# Patient Record
Sex: Male | Born: 1950
Health system: Southern US, Community
[De-identification: ages and names within clinical notes are randomized; demographics above are authoritative.]

## PROBLEM LIST (undated history)

## (undated) DIAGNOSIS — I10 Essential (primary) hypertension: Secondary | ICD-10-CM

## (undated) DIAGNOSIS — C61 Malignant neoplasm of prostate: Secondary | ICD-10-CM

## (undated) DIAGNOSIS — E7800 Pure hypercholesterolemia, unspecified: Secondary | ICD-10-CM

## (undated) DIAGNOSIS — Z973 Presence of spectacles and contact lenses: Secondary | ICD-10-CM

## (undated) DIAGNOSIS — E785 Hyperlipidemia, unspecified: Secondary | ICD-10-CM

## (undated) HISTORY — PX: INGUINAL HERNIA REPAIR: SUR1180

## (undated) HISTORY — PX: PROSTATE BIOPSY: SHX241

## (undated) HISTORY — PX: HERNIA REPAIR: SHX51

---
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
## 2002-10-21 ENCOUNTER — Observation Stay (HOSPITAL_COMMUNITY): Admission: RE | Admit: 2002-10-21 | Discharge: 2002-10-22 | Payer: Self-pay | Admitting: General Surgery

## 2007-01-17 ENCOUNTER — Emergency Department (HOSPITAL_COMMUNITY): Admission: EM | Admit: 2007-01-17 | Discharge: 2007-01-17 | Payer: Self-pay | Admitting: Emergency Medicine

## 2007-11-25 ENCOUNTER — Emergency Department (HOSPITAL_COMMUNITY): Admission: EM | Admit: 2007-11-25 | Discharge: 2007-11-25 | Payer: Self-pay | Admitting: Emergency Medicine

## 2010-10-04 NOTE — Op Note (Signed)
NAME:  Kent Smith, Kent Smith                        ACCOUNT NO.:  [id number]   MEDICAL RECORD NO.:  [id number]                   PATIENT TYPE:  AMB   LOCATION:  DAY                                  FACILITY:  [facility]   PHYSICIAN:  Barbaraann Barthel, M.D.              DATE OF BIRTH:  1950/06/17   DATE OF PROCEDURE:  10/21/2002  DATE OF DISCHARGE:                                 OPERATIVE REPORT   PREOPERATIVE DIAGNOSIS:  Left inguinal hernia.   POSTOPERATIVE DIAGNOSIS:  Left inguinal hernia.   PROCEDURE:  Left inguinal herniorrhaphy.   SURGEON:  Barbaraann Barthel, M.D.   SPECIMENS:  Lipoma, properitoneal fat, and hernia sac.   NOTE:  This is a 60 year old black male who presented with a left groin  mass, referred by Tesfaye D. Felecia Shelling, M.D., for left inguinal hernia.  He had  clinically a left inguinal hernia which was non-incarcerated.  We planned  for surgery as soon as possible, as this patient had a hiatus in his job  obligations and we were able to put him on the surgery as soon as he liked.  We discussed the surgery with him in detail, discussing complications not  limited to but including bleeding, infection, and recurrence.  Informed  consent was obtained.   GROSS OPERATIVE FINDINGS:  The patient had a sliding inguinal hernia with  colon attached to the hernia sac.   TECHNIQUE:  The patient was placed in the supine position after he had  adequate administration of spinal anesthesia and a Foley catheter was  aseptically inserted, and his left groin was shaved, prepped, and draped in  the usual manner.  An incision was carried out between the anterior superior  iliac spine and the pubic tubercle through skin, subcutaneous tissue, and  Scarpa's layer down to the external oblique, which was opened through the  external ring.  The cord structures were then dissected free from the hernia  sac.  The hernia sac had a sliding contained sigmoid colon within it.  this  was carefully  dissected free and the redundant portion of the hernia sac was  amputated after closing the sac under direct vision with a 2-0 Bralon  suture.  The inguinal floor was then repaired with interrupted 2-0 Bralon  sutures, suturing transversus abdominis and transversalis fascia to Cooper's  ligament and Poupart's ligament with interrupted sutures.  These were  cinched tightly after performing a relaxing incision.  I then used 0.5%  Sensorcaine to help with postoperative discomfort and then after irrigating  and returning the cord structures to their anatomic position, the external  oblique was approximated over them using running 3-0 Vicryl suture.  The  subcu was irrigated and the skin was approximated with a stapling device,  and a sterile dressing was  applied.  Prior to closure all sponge, needle, and instrument counts were  found to be correct.  Estimated blood loss was minimal.  The patient  received 1700 mL of crystalloid intraoperatively.  No drains were placed.  There were no complications.                                               Barbaraann Barthel, M.D.    WB/MEDQ  D:  10/21/2002  T:  10/21/2002  Job:  [id number]   cc:   Tesfaye D. Felecia Shelling, M.D.  [address]  [place]  [place] [address]  Fax: [phone]

## 2011-10-08 ENCOUNTER — Emergency Department (HOSPITAL_COMMUNITY)
Admission: EM | Admit: 2011-10-08 | Discharge: 2011-10-08 | Disposition: A | Discharge status: Home / Self Care | Payer: Worker's Compensation | Attending: Emergency Medicine | Admitting: Emergency Medicine

## 2011-10-08 ENCOUNTER — Encounter (HOSPITAL_COMMUNITY): Payer: Self-pay | Admitting: Emergency Medicine

## 2011-10-08 DIAGNOSIS — Y9269 Other specified industrial and construction area as the place of occurrence of the external cause: Secondary | ICD-10-CM | POA: Insufficient documentation

## 2011-10-08 DIAGNOSIS — S61409A Unspecified open wound of unspecified hand, initial encounter: Secondary | ICD-10-CM | POA: Insufficient documentation

## 2011-10-08 DIAGNOSIS — S61412A Laceration without foreign body of left hand, initial encounter: Secondary | ICD-10-CM

## 2011-10-08 DIAGNOSIS — W268XXA Contact with other sharp object(s), not elsewhere classified, initial encounter: Secondary | ICD-10-CM | POA: Insufficient documentation

## 2011-10-08 DIAGNOSIS — Y990 Civilian activity done for income or pay: Secondary | ICD-10-CM | POA: Insufficient documentation

## 2011-10-08 MED ORDER — LIDOCAINE HCL (PF) 1 % IJ SOLN
INTRAMUSCULAR | Status: AC
  Administered 2011-10-08: 14:00:00
  Filled 2011-10-08: qty 5

## 2011-10-08 MED ORDER — TETANUS-DIPHTH-ACELL PERTUSSIS 5-2.5-18.5 LF-MCG/0.5 IM SUSP
0.5000 mL | Freq: Once | INTRAMUSCULAR | Status: AC
  Administered 2011-10-08: 0.5 mL via INTRAMUSCULAR
  Filled 2011-10-08: qty 0.5

## 2011-10-08 NOTE — ED Provider Notes (Signed)
History     CSN: [id number]  Arrival date & time 10/08/11  1147   None     Chief Complaint  Patient presents with  . Laceration    (Consider location/radiation/quality/duration/timing/severity/associated sxs/prior treatment) Patient is a 61 y.o. male presenting with skin laceration. The history is provided by the patient. No language interpreter was used.  Laceration  The incident occurred 1 to 2 hours ago. The laceration is located on the left hand. The laceration is 2 cm in size. Injury mechanism: washing dishes at work and cut L hand on chipped dish. The pain is mild. He reports no foreign bodies present. His tetanus status is out of date.    History reviewed. No pertinent past medical history.  Past Surgical History  Procedure Date  . Hernia repair     History reviewed. No pertinent family history.  History  Substance Use Topics  . Smoking status: Not on file  . Smokeless tobacco: Not on file  . Alcohol Use: Yes      Review of Systems  Skin: Positive for wound.  Neurological: Negative for numbness.  All other systems reviewed and are negative.    Allergies  Review of patient's allergies indicates no known allergies.  Home Medications   Current Outpatient Rx  Name Route Sig Dispense Refill  . IBUPROFEN 200 MG PO TABS Oral Take 200 mg by mouth every 6 (six) hours as needed. For headache or body pain      BP 138/80  Pulse 79  Resp 20  Ht 5\' 8"  (1.727 m)  Wt 210 lb (95.255 kg)  BMI 31.93 kg/m2  SpO2 100%  Physical Exam  Nursing note and vitals reviewed. Constitutional: He is oriented to person, place, and time. He appears well-developed and well-nourished.  HENT:  Head: Normocephalic and atraumatic.  Eyes: EOM are normal.  Neck: Normal range of motion.  Cardiovascular: Normal rate, regular rhythm, normal heart sounds and intact distal pulses.   Pulmonary/Chest: Effort normal and breath sounds normal. No respiratory distress.  Abdominal: Soft. He  exhibits no distension. There is no tenderness.  Musculoskeletal: Normal range of motion. He exhibits tenderness.       Left hand: He exhibits tenderness and laceration. He exhibits normal range of motion, no bony tenderness, normal capillary refill, no deformity and no swelling. normal sensation noted. Normal strength noted.       Hands:      Linear lac to volar aspect of 1st MCP.  No active bleeding.  SQ at wound base.  FROM  Neurological: He is alert and oriented to person, place, and time.  Skin: Skin is warm and dry.  Psychiatric: He has a normal mood and affect. Judgment normal.    ED Course  LACERATION REPAIR Date/Time: 10/08/2011 2:10 PM Performed by: Worthy Rancher Authorized by: Worthy Rancher Consent: Verbal consent obtained. Written consent not obtained. Risks and benefits: risks, benefits and alternatives were discussed Consent given by: patient Patient understanding: patient states understanding of the procedure being performed Site marked: the operative site was not marked Imaging studies: imaging studies not available Required items: required blood products, implants, devices, and special equipment available Patient identity confirmed: verbally with patient Time out: Immediately prior to procedure a "time out" was called to verify the correct patient, procedure, equipment, support staff and site/side marked as required. Laceration length: 2 cm Foreign bodies: no foreign bodies Tendon involvement: none Nerve involvement: none Vascular damage: no Anesthesia: local infiltration Local anesthetic: lidocaine 2% without  epinephrine Anesthetic total: 4 ml Preparation: Patient was prepped and draped in the usual sterile fashion. Irrigation solution: saline Irrigation method: syringe Amount of cleaning: extensive Debridement: none Degree of undermining: none Skin closure: 4-0 nylon Technique: simple Approximation: close Approximation difficulty: simple Dressing:  4x4 sterile gauze and antibiotic ointment Patient tolerance: Patient tolerated the procedure well with no immediate complications.   (including critical care time)  Labs Reviewed - No data to display No results found.   1. Hand laceration, left, initial encounter       MDM  Wash BID, abx ointment.  Suture removal in 8-10 days.        Worthy Rancher, PA 10/08/11 [phone]

## 2011-10-08 NOTE — Discharge Instructions (Signed)
Sutured Wound Care Sutures are stitches that can be used to close wounds. Wound care helps prevent pain and infection.  HOME CARE INSTRUCTIONS   Rest and elevate the injured area until all the pain and swelling are gone.   Only take over-the-counter or prescription medicines for pain, discomfort, or fever as directed by your caregiver.   After 48 hours, gently wash the area with mild soap and water once a day, or as directed. Rinse off the soap. Pat the area dry with a clean towel. Do not rub the wound. This may cause bleeding.   Follow your caregiver's instructions for how often to change the bandage (dressing). Stop using a dressing after 2 days or after the wound stops draining.   If the dressing sticks, moisten it with soapy water and gently remove it.   Apply ointment on the wound as directed.   Avoid stretching a sutured wound.   Drink enough fluids to keep your urine clear or pale yellow.   Follow up with your caregiver for suture removal as directed.   Use sunscreen on your wound for the next 3 to 6 months so the scar will not darken.  SEEK IMMEDIATE MEDICAL CARE IF:   Your wound becomes red, swollen, hot, or tender.   You have increasing pain in the wound.   You have a red streak that extends from the wound.   There is pus coming from the wound.   You have a fever.   You have shaking chills.   There is a bad smell coming from the wound.   You have persistent bleeding from the wound.  MAKE SURE YOU:   Understand these instructions.   Will watch your condition.   Will get help right away if you are not doing well or get worse.  Document Released: 06/12/2004 Document Revised: 04/24/2011 Document Reviewed: 09/08/2010 [facility] Patient Information 2012 [place], [place].   Wash wound twice daily with soap and water then apply antibiotic ointment.  Suture removal in 8-10 days.  Return sooner if any problems.

## 2011-10-08 NOTE — ED Notes (Signed)
Pt was washing dishes at [employer] and cut his left thumb with glass. Pt last tetanus is unknown.

## 2011-10-09 NOTE — ED Provider Notes (Signed)
Medical screening examination/treatment/procedure(s) were performed by non-physician practitioner and as supervising physician I was immediately available for consultation/collaboration.  Donnetta Hutching, MD 10/09/11 [phone]

## 2011-10-17 ENCOUNTER — Emergency Department (HOSPITAL_COMMUNITY)
Admission: EM | Admit: 2011-10-17 | Discharge: 2011-10-17 | Disposition: A | Discharge status: Home / Self Care | Payer: Self-pay | Attending: Emergency Medicine | Admitting: Emergency Medicine

## 2011-10-17 ENCOUNTER — Encounter (HOSPITAL_COMMUNITY): Payer: Self-pay | Admitting: *Deleted

## 2011-10-17 DIAGNOSIS — Z4802 Encounter for removal of sutures: Secondary | ICD-10-CM | POA: Insufficient documentation

## 2011-10-17 NOTE — ED Provider Notes (Signed)
History     CSN: [id number]  Arrival date & time 10/17/11  1435   First MD Initiated Contact with Patient 10/17/11 1439      No chief complaint on file.   (Consider location/radiation/quality/duration/timing/severity/associated sxs/prior treatment) Patient is a 61 y.o. male presenting with suture removal. The history is provided by the patient.  Suture / Staple Removal  The sutures were placed 7 to 10 days ago. There has been no treatment since the wound repair. There has been no drainage from the wound. There is no redness present. There is no swelling present. The pain has no pain.    No past medical history on file.  Past Surgical History  Procedure Date  . Hernia repair     No family history on file.  History  Substance Use Topics  . Smoking status: Not on file  . Smokeless tobacco: Not on file  . Alcohol Use: Yes      Review of Systems  Constitutional: Negative for fever.  Musculoskeletal: Negative for joint swelling and arthralgias.  Skin: Positive for wound. Negative for rash.    Allergies  Review of patient's allergies indicates no known allergies.  Home Medications   Current Outpatient Rx  Name Route Sig Dispense Refill  . IBUPROFEN 200 MG PO TABS Oral Take 200 mg by mouth every 6 (six) hours as needed. For headache or body pain      There were no vitals taken for this visit.  Physical Exam  Constitutional: He is oriented to person, place, and time. He appears well-developed and well-nourished.  HENT:  Head: Normocephalic.  Cardiovascular: Normal rate.   Pulmonary/Chest: Effort normal.  Musculoskeletal: He exhibits no tenderness.  Neurological: He is alert and oriented to person, place, and time. No sensory deficit.  Skin: Laceration noted.       Healthy well appearing laceration repair left web space between thumb and index finger.  No induration,  Swelling ,  Erythema or drainage.    ED Course  Procedures (including critical care  time)  Labs Reviewed - No data to display No results found.   No diagnosis found.  #4 sutures removed without complication.  MDM  Prn f/u.        Burgess Amor, PA 10/17/11 1450

## 2011-10-17 NOTE — ED Notes (Signed)
Pt presents for suture removal in left thumb. No s/s of infection. Pt denies pain.

## 2011-10-17 NOTE — Discharge Instructions (Signed)
Suture Removal  Your caregiver has removed your sutures today. If skin adhesive strips were applied at the time of suturing, or applied following removal of the sutures today, they will begin to peel off in a couple more days. If skin adhesive strips remain after 14 days, they may be removed.  HOME CARE INSTRUCTIONS     Change any bandages (dressings) at least once a day or as directed by your caregiver. If the bandage sticks, soak it off with warm, soapy water.    Wash the area with soap and water to remove all the cream or ointment (if you were instructed to use any) 2 times a day. Rinse off the soap and pat the area dry with a clean towel.    Reapply cream or ointment as directed by your caregiver. This will help prevent infection and keep the bandage from sticking.    Keep the wound area dry and clean. If the bandage becomes wet, dirty, or develops a bad smell, change it as soon as possible.    Only take over-the-counter or prescription medicines for pain, discomfort, or fever as directed by your caregiver.    Use sunscreen when out in the sun. New scars become sunburned easily.    Return to your caregivers office in in 7 days or as directed to have your sutures removed.   You may need a tetanus shot if:   You cannot remember when you had your last tetanus shot.    You have never had a tetanus shot.    The injury broke your skin.   If you got a tetanus shot, your arm may swell, get red, and feel warm to the touch. This is common and not a problem. If you need a tetanus shot and you choose not to have one, there is a rare chance of getting tetanus. Sickness from tetanus can be serious.  SEEK IMMEDIATE MEDICAL CARE IF:     There is redness, swelling, or increasing pain in the wound.    Pus is coming from the wound.    An unexplained oral temperature above 102 F (38.9 C) develops.    You notice a bad smell coming from the wound or dressing.     The wound breaks open (edges not staying together) after sutures have been removed.   Document Released: 01/28/2001 Document Revised: 04/24/2011 Document Reviewed: 03/29/2007  [employer] Patient Information 2012 [employer].

## 2011-10-17 NOTE — ED Provider Notes (Signed)
Medical screening examination/treatment/procedure(s) were performed by non-physician practitioner and as supervising physician I was immediately available for consultation/collaboration.   Dayton Bailiff, MD 10/17/11 1501

## 2011-10-17 NOTE — ED Notes (Signed)
Pt presents to ED for suture removal of left thumb. No s/s of infection

## 2015-06-26 ENCOUNTER — Encounter (HOSPITAL_COMMUNITY): Payer: Self-pay

## 2015-06-26 ENCOUNTER — Emergency Department (HOSPITAL_COMMUNITY)
Admission: EM | Admit: 2015-06-26 | Discharge: 2015-06-26 | Disposition: A | Discharge status: Home / Self Care | Payer: Commercial Managed Care - HMO | Attending: Emergency Medicine | Admitting: Emergency Medicine

## 2015-06-26 DIAGNOSIS — Y9389 Activity, other specified: Secondary | ICD-10-CM | POA: Diagnosis not present

## 2015-06-26 DIAGNOSIS — Y998 Other external cause status: Secondary | ICD-10-CM | POA: Diagnosis not present

## 2015-06-26 DIAGNOSIS — W260XXA Contact with knife, initial encounter: Secondary | ICD-10-CM | POA: Diagnosis not present

## 2015-06-26 DIAGNOSIS — Y9289 Other specified places as the place of occurrence of the external cause: Secondary | ICD-10-CM | POA: Diagnosis not present

## 2015-06-26 DIAGNOSIS — S61411A Laceration without foreign body of right hand, initial encounter: Secondary | ICD-10-CM | POA: Diagnosis not present

## 2015-06-26 MED ORDER — LIDOCAINE HCL (PF) 1 % IJ SOLN
5.0000 mL | Freq: Once | INTRAMUSCULAR | Status: DC
  Filled 2015-06-26: qty 5

## 2015-06-26 NOTE — ED Provider Notes (Signed)
CSN: [id number]     Arrival date & time 06/26/15  1748 History   First MD Initiated Contact with Patient 06/26/15 1816     Chief Complaint  Patient presents with  . Laceration     (Consider location/radiation/quality/duration/timing/severity/associated sxs/prior Treatment) Patient is a 65 y.o. male presenting with skin laceration. The history is provided by the patient.  Laceration Location:  Hand Hand laceration location:  R palm Depth:  Through underlying tissue Quality: straight   Bleeding: controlled   Laceration mechanism:  Knife  Kent Smith is a 66 y.o. male who presents to the ED with a laceration to the palm of the right hand. He accidentally cut it with a knife. Up to date on tetanus.  Patient is right hand dominant.  History reviewed. No pertinent past medical history. Past Surgical History  Procedure Laterality Date  . Hernia repair     No family history on file. Social History  Substance Use Topics  . Smoking status: Never Smoker   . Smokeless tobacco: None  . Alcohol Use: Yes     Comment: 2 or 3 beers a week    Review of Systems Negative except as stated in HPI   Allergies  Review of patient's allergies indicates no known allergies.  Home Medications   Prior to Admission medications   Medication Sig Start Date End Date Taking? Authorizing Provider  ibuprofen (ADVIL,MOTRIN) 200 MG tablet Take 200 mg by mouth every 6 (six) hours as needed. For headache or body pain    Historical Provider, MD   BP 140/90 mmHg  Pulse 88  Temp(Src) 98.6 F (37 C) (Oral)  Resp 20  Ht 5\' 8"  (1.727 m)  Wt 97.523 kg  BMI 32.70 kg/m2  SpO2 98% Physical Exam  Constitutional: He is oriented to person, place, and time. He appears well-developed and well-nourished. No distress.  HENT:  Head: Normocephalic and atraumatic.  Eyes: EOM are normal.  Neck: Neck supple.  Cardiovascular: Normal rate.   Pulmonary/Chest: Effort normal.  Abdominal: Soft. There is no  tenderness.  Musculoskeletal: Normal range of motion.       Right hand: He exhibits tenderness and laceration. He exhibits normal range of motion, normal capillary refill, no deformity and no swelling. Normal sensation noted. Normal strength noted.       Hands: Neurological: He is alert and oriented to person, place, and time. No cranial nerve deficit.  Skin: Skin is warm and dry.  Psychiatric: He has a normal mood and affect. His behavior is normal.  Nursing note and vitals reviewed.   ED Course  .Marland KitchenLaceration Repair Date/Time: 06/26/2015 6:59 PM Performed by: Ashley Murrain Authorized by: Ashley Murrain Consent: Verbal consent obtained. Risks and benefits: risks, benefits and alternatives were discussed Consent given by: patient Patient understanding: patient states understanding of the procedure being performed Required items: required blood products, implants, devices, and special equipment available Patient identity confirmed: verbally with patient Body area: upper extremity Laceration length: 1.5 cm Foreign bodies: no foreign bodies Tendon involvement: none Nerve involvement: none Vascular damage: no Anesthesia: local infiltration Local anesthetic: lidocaine 1% without epinephrine Anesthetic total: 2 ml Patient sedated: no Preparation: Patient was prepped and draped in the usual sterile fashion. Irrigation solution: saline Irrigation method: syringe Amount of cleaning: standard Debridement: none Degree of undermining: none Skin closure: 5-0 Prolene Number of sutures: 5 Technique: simple Approximation: close Approximation difficulty: simple Dressing: pressure dressing Patient tolerance: Patient tolerated the procedure well with no immediate complications  MDM  65 y.o. male with laceration of the right hand stable for d/c without focal neuro deficits. Discussed with the patient plan of care and all questioned fully answered. He will follow up in one week for suture  removal or sooner if any problems arise.   Final diagnoses:  Laceration of hand, right, initial encounter        [facility], NP 06/26/15 1902  Milton Ferguson, MD 06/26/15 [phone]

## 2015-06-26 NOTE — ED Notes (Signed)
Pt reports accidentally cut r palm with a knife.  Laceration to r palm.  Bleeding controlled.  Dressed wound in triage.

## 2015-06-26 NOTE — Discharge Instructions (Signed)
Sutured Wound Care Sutures are stitches that can be used to close wounds. Taking care of your wound properly can help prevent pain and infection. It can also help your wound to heal more quickly. HOW TO CARE FOR YOUR SUTURED WOUND Wound Care  Keep the wound clean and dry.  If you were given a bandage (dressing), change it at least one time per day or as told by your doctor. You should also change it if it gets wet or dirty.  Keep the wound completely dry for the first 24 hours or as told by your doctor. After that time, you may shower or bathe. However, make sure that the wound is not soaked in water until the sutures have been removed.  Clean the wound one time each day or as told by your doctor.  Wash the wound with soap and water.  Rinse the wound with water to remove all soap.  Pat the wound dry with a clean towel. Do not rub the wound.  After cleaning the wound, put a thin layer of antibiotic ointment on it as told your doctor. This ointment:  Helps to prevent infection.  Keeps the bandage from sticking to the wound.  Have the sutures removed as told by your doctor. General Instructions  Take or apply medicines only as told by your doctor.  To help prevent scarring, make sure to cover your wound with sunscreen whenever you are outside after the sutures are removed and the wound is healed. Make sure to wear a sunscreen of at least 30 SPF.  If you were prescribed an antibiotic medicine or ointment, finish all of it even if you start to feel better.  Do not scratch or pick at the wound.  Keep all follow-up visits as told by your doctor. This is important.  Check your wound every day for signs of infection. Watch for:  Redness, swelling, or pain.  Fluid, blood, or pus.  Raise (elevate) the injured area above the level of your heart while you are sitting or lying down, if possible.  Avoid stretching your wound.  Drink enough fluids to keep your pee (urine) clear or  pale yellow. GET HELP IF:  You were given a tetanus shot and you have any of these where the needle went in:  Swelling.  Very bad pain.  Redness.  Bleeding.  You have a fever.  A wound that was closed breaks open.  You notice a bad smell coming from the wound.  You notice something coming out of the wound, such as wood or glass.  Medicine does not help your pain.  You have any of these at the site of the wound.  More redness.  More swelling.  More pain.  You have any of these coming from the wound.  Fluid.  Blood.  Pus.  You notice a change in the color of your skin near the wound.  You need to change the bandage often due to fluid, blood, or pus coming from the wound.  You have a new rash.  You have numbness around the wound. GET HELP RIGHT AWAY IF:  You have very bad swelling around the wound.  Your pain suddenly gets worse and is very bad.  You have painful lumps near the wound or on skin that is anywhere on your body.  You have a red streak going away from the wound.  The wound is on your hand or foot and you cannot move a finger or toe like normal.  The wound is on your hand or foot and you notice that your fingers or toes look pale or bluish.   This information is not intended to replace advice given to you by your health care provider. Make sure you discuss any questions you have with your health care provider.   Document Released: 10/22/2007 Document Revised: 09/19/2014 Document Reviewed: 12/15/2012 Elsevier Interactive Patient Education [employer].

## 2016-05-31 ENCOUNTER — Encounter (HOSPITAL_COMMUNITY): Payer: Self-pay | Admitting: Emergency Medicine

## 2016-05-31 ENCOUNTER — Emergency Department (HOSPITAL_COMMUNITY)
Admission: EM | Admit: 2016-05-31 | Discharge: 2016-06-01 | Disposition: A | Discharge status: Home / Self Care | Payer: Medicare PPO | Attending: Emergency Medicine | Admitting: Emergency Medicine

## 2016-05-31 DIAGNOSIS — J069 Acute upper respiratory infection, unspecified: Secondary | ICD-10-CM | POA: Diagnosis not present

## 2016-05-31 DIAGNOSIS — R0981 Nasal congestion: Secondary | ICD-10-CM | POA: Diagnosis present

## 2016-05-31 MED ORDER — BENZONATATE 100 MG PO CAPS: 200.0000 mg | ORAL_CAPSULE | Freq: Three times a day (TID) | ORAL | 0 refills | Status: DC | PRN

## 2016-05-31 MED ORDER — BENZONATATE 100 MG PO CAPS
200.0000 mg | ORAL_CAPSULE | Freq: Once | ORAL | Status: AC
  Administered 2016-05-31: 200 mg via ORAL
  Filled 2016-05-31: qty 2

## 2016-05-31 NOTE — Discharge Instructions (Signed)
As discussed,  Coricidin brand cough and cold medications are safer for you and won't interfere with your blood pressure.  I do not recommend that you keep taking the alka seltzer cold formula medicine.  You should have your blood pressure rechecked within the next week.  Rest and make sure you are drinking plenty of fluids.  Get rechecked if your symptoms are not improving or for any worsening symptoms.

## 2016-05-31 NOTE — ED Triage Notes (Signed)
Pt states he has had a cough and nasal congestion  for 2 days.

## 2016-06-01 NOTE — ED Provider Notes (Signed)
[facility] DEPT Provider Note   CSN: [id number] Arrival date & time: 05/31/16  2241     History   Chief Complaint Chief Complaint  Patient presents with  . Cough  . Nasal Congestion    HPI Kent Smith is a 66 y.o. male presenting with a 2 day history of uri type symptoms which includes nasal congestion with yellow to white rhinorrhea, post nasal drip,  and nonproductive cough.  Symptoms do not include shortness of breath, chest pain,  Nausea, vomiting or diarrhea.  The patient has taken alka seltzer cold formula and cough drops prior to arrival with transient improvement in symptoms. .  The history is provided by the patient.    History reviewed. No pertinent past medical history.  There are no active problems to display for this patient.   Past Surgical History:  Procedure Laterality Date  . HERNIA REPAIR         Home Medications    Prior to Admission medications   Medication Sig Start Date End Date Taking? Authorizing Provider  benzonatate (TESSALON) 100 MG capsule Take 2 capsules (200 mg total) by mouth 3 (three) times daily as needed. 05/31/16   Evalee Jefferson, PA-C  ibuprofen (ADVIL,MOTRIN) 200 MG tablet Take 200 mg by mouth every 6 (six) hours as needed. For headache or body pain    Historical Provider, MD    Family History No family history on file.  Social History Social History  Substance Use Topics  . Smoking status: Never Smoker  . Smokeless tobacco: Never Used  . Alcohol use Yes     Comment: 2 or 3 beers a week     Allergies   Patient has no known allergies.   Review of Systems Review of Systems  Constitutional: Negative for chills and fever.  HENT: Positive for congestion, postnasal drip, rhinorrhea and sore throat. Negative for ear pain, sinus pressure, trouble swallowing and voice change.   Eyes: Negative for discharge.  Respiratory: Positive for cough. Negative for shortness of breath, wheezing and stridor.   Cardiovascular:  Negative for chest pain.  Gastrointestinal: Negative for abdominal pain.  Genitourinary: Negative.      Physical Exam Updated Vital Signs BP 162/85 (BP Location: Left Arm)   Pulse 107   Temp 98.7 F (37.1 C) (Temporal)   Resp 17   Ht 5\' 8"  (1.727 m)   Wt 99.8 kg   SpO2 100%   BMI 33.45 kg/m   Physical Exam  Constitutional: He is oriented to person, place, and time. He appears well-developed and well-nourished.  HENT:  Head: Normocephalic and atraumatic.  Nose: Mucosal edema and rhinorrhea present.  Mouth/Throat: Uvula is midline, oropharynx is clear and moist and mucous membranes are normal. No oropharyngeal exudate, posterior oropharyngeal edema, posterior oropharyngeal erythema or tonsillar abscesses.  Yellow post nasal drip present  Eyes: Conjunctivae are normal.  Neck: Normal range of motion. Neck supple.  Cardiovascular: Normal rate and normal heart sounds.   Pulmonary/Chest: Effort normal. No respiratory distress. He has no wheezes. He has no rales. He exhibits no tenderness.  Abdominal: Soft. He exhibits no mass. There is no tenderness. There is no guarding.  Musculoskeletal: Normal range of motion.  Lymphadenopathy:    He has no cervical adenopathy.  Neurological: He is alert and oriented to person, place, and time.  Skin: Skin is warm and dry. No rash noted.  Psychiatric: He has a normal mood and affect.     ED Treatments / Results  Labs (all  labs ordered are listed, but only abnormal results are displayed) Labs Reviewed - No data to display  EKG  EKG Interpretation None       Radiology No results found.  Procedures Procedures (including critical care time)  Medications Ordered in ED Medications  benzonatate (TESSALON) capsule 200 mg (200 mg Oral Given 05/31/16 2359)     Initial Impression / Assessment and Plan / ED Course  I have reviewed the triage vital signs and the nursing notes.  Pertinent labs & imaging results that were available  during my care of the patient were reviewed by me and considered in my medical decision making (see chart for details).  Clinical Course     Advised to stop taking alka selter cold due to bp elevation, recommended coricidin.  Tessalon perles prescribed for cough, rest, fluids, recheck for any worsened sx.  Pt with exam c/w viral uri.  Lungs ctab, no cp,  No wheezing or sob.  Final Clinical Impressions(s) / ED Diagnoses   Final diagnoses:  Upper respiratory tract infection, unspecified type    New Prescriptions Discharge Medication List as of 05/31/2016 11:53 PM    START taking these medications   Details  benzonatate (TESSALON) 100 MG capsule Take 2 capsules (200 mg total) by mouth 3 (three) times daily as needed., Starting Sat 05/31/2016, Print         Evalee Jefferson, PA-C 06/01/16 [place], MD 06/01/16 [phone]

## 2016-09-13 ENCOUNTER — Encounter (HOSPITAL_COMMUNITY): Payer: Self-pay | Admitting: *Deleted

## 2016-09-13 ENCOUNTER — Emergency Department (HOSPITAL_COMMUNITY)
Admission: EM | Admit: 2016-09-13 | Discharge: 2016-09-13 | Disposition: A | Discharge status: Home / Self Care | Payer: Medicare PPO | Attending: Emergency Medicine | Admitting: Emergency Medicine

## 2016-09-13 DIAGNOSIS — Z87891 Personal history of nicotine dependence: Secondary | ICD-10-CM | POA: Diagnosis not present

## 2016-09-13 DIAGNOSIS — I10 Essential (primary) hypertension: Secondary | ICD-10-CM | POA: Diagnosis present

## 2016-09-13 LAB — I-STAT CHEM 8, ED
BUN: 17 mg/dL (ref 6–20)
CREATININE: 1.2 mg/dL (ref 0.61–1.24)
Calcium, Ion: 1.14 mmol/L — ABNORMAL LOW (ref 1.15–1.40)
Chloride: 103 mmol/L (ref 101–111)
Glucose, Bld: 85 mg/dL (ref 65–99)
HEMATOCRIT: 42 % (ref 39.0–52.0)
Hemoglobin: 14.3 g/dL (ref 13.0–17.0)
Potassium: 3.6 mmol/L (ref 3.5–5.1)
Sodium: 140 mmol/L (ref 135–145)
TCO2: 25 mmol/L (ref 0–100)

## 2016-09-13 MED ORDER — HYDROCHLOROTHIAZIDE 25 MG PO TABS: 25.0000 mg | ORAL_TABLET | Freq: Every day | ORAL | 1 refills | Status: DC

## 2016-09-13 MED ORDER — HYDROCHLOROTHIAZIDE 25 MG PO TABS: 25.0000 mg | ORAL_TABLET | Freq: Every day | ORAL | Status: DC

## 2016-09-13 NOTE — Discharge Instructions (Signed)
Please see your primary doctor as soon as possible for optimal blood pressure control. Start the medicine prescribed.

## 2016-09-13 NOTE — ED Notes (Addendum)
Resting quietly. NAD

## 2016-09-13 NOTE — ED Triage Notes (Signed)
Pt reports he checked his BP today at [facility] and his systolic pressure was 847. Pt denies any hx of HTN. Denies HA, n/v, blurry vision, dizziness.

## 2016-09-13 NOTE — ED Provider Notes (Signed)
[facility] DEPT Provider Note   CSN: [id number] Arrival date & time: 09/13/16  1529     History   Chief Complaint Chief Complaint  Patient presents with  . Hypertension    HPI Kent Smith is a 66 y.o. male.  HPI Pt comes in with cc of elevated BP. Pt reports that he used to be on BP meds in the past, but  His doctors havent prescribed any in the last 1+ year. Today, after work he felt a bit woozy, and so he checked his BP and it was in the 170s SBP. Later on, when he was at [facility], he checked his BP again, and it was in the 180s, so he decided to come to the ER. Pt has no chest pain, dib, headaches, vision changes, dizziness, numbness at the moment.  History reviewed. No pertinent past medical history.  There are no active problems to display for this patient.   Past Surgical History:  Procedure Laterality Date  . HERNIA REPAIR         Home Medications    Prior to Admission medications   Medication Sig Start Date End Date Taking? Authorizing Provider  naproxen sodium (ANAPROX) 220 MG tablet Take 440 mg by mouth 2 (two) times daily as needed (for pain).   Yes Historical Provider, MD  hydrochlorothiazide (HYDRODIURIL) 25 MG tablet Take 1 tablet (25 mg total) by mouth daily. 09/13/16   Varney Biles, MD    Family History No family history on file.  Social History Social History  Substance Use Topics  . Smoking status: Former Research scientist (life sciences)  . Smokeless tobacco: Never Used  . Alcohol use Yes     Comment: 2 or 3 beers a week     Allergies   Patient has no known allergies.   Review of Systems Review of Systems  Constitutional: Negative for activity change.  Eyes: Negative for visual disturbance.  Respiratory: Negative for shortness of breath.   Cardiovascular: Negative for chest pain.  Neurological: Negative for numbness and headaches.     Physical Exam Updated Vital Signs BP (!) 152/87   Pulse 70   Temp 98.8 F (37.1 C) (Oral)   Resp 19   Ht  5\' 8"  (1.727 m)   Wt 213 lb 3.2 oz (96.7 kg)   SpO2 96%   BMI 32.42 kg/m   Physical Exam  Constitutional: He is oriented to person, place, and time. He appears well-developed.  HENT:  Head: Atraumatic.  Eyes: Conjunctivae are normal. Pupils are equal, round, and reactive to light.  Neck: Neck supple.  Cardiovascular: Normal rate.   Pulmonary/Chest: Effort normal.  Neurological: He is alert and oriented to person, place, and time. No cranial nerve deficit.  Skin: Skin is warm.  Nursing note and vitals reviewed.    ED Treatments / Results  Labs (all labs ordered are listed, but only abnormal results are displayed) Labs Reviewed  I-STAT CHEM 8, ED - Abnormal; Notable for the following:       Result Value   Calcium, Ion 1.14 (*)    All other components within normal limits    EKG  EKG Interpretation None       Radiology No results found.  Procedures Procedures (including critical care time)  Medications Ordered in ED Medications - No data to display   Initial Impression / Assessment and Plan / ED Course  I have reviewed the triage vital signs and the nursing notes.  Pertinent labs & imaging results that were  available during my care of the patient were reviewed by me and considered in my medical decision making (see chart for details).     Pt likely has essential HTN. Cr is normal. We will start him on HCTZ. Pt advised to see his [place] doctor ASAP for optimal care of his BP.  Final Clinical Impressions(s) / ED Diagnoses   Final diagnoses:  Essential hypertension    New Prescriptions Discharge Medication List as of 09/13/2016  4:38 PM    START taking these medications   Details  hydrochlorothiazide (HYDRODIURIL) 25 MG tablet Take 1 tablet (25 mg total) by mouth daily., Starting Sat 09/13/2016, Print         Varney Biles, MD 09/13/16 1747

## 2019-04-12 ENCOUNTER — Encounter (HOSPITAL_COMMUNITY): Payer: Self-pay | Admitting: Emergency Medicine

## 2019-04-12 ENCOUNTER — Other Ambulatory Visit: Payer: Self-pay

## 2019-04-12 ENCOUNTER — Emergency Department (HOSPITAL_COMMUNITY)
Admission: EM | Admit: 2019-04-12 | Discharge: 2019-04-12 | Disposition: A | Discharge status: Home / Self Care | Payer: No Typology Code available for payment source | Attending: Emergency Medicine | Admitting: Emergency Medicine

## 2019-04-12 DIAGNOSIS — Z79899 Other long term (current) drug therapy: Secondary | ICD-10-CM | POA: Diagnosis not present

## 2019-04-12 DIAGNOSIS — I10 Essential (primary) hypertension: Secondary | ICD-10-CM

## 2019-04-12 DIAGNOSIS — Z87891 Personal history of nicotine dependence: Secondary | ICD-10-CM | POA: Diagnosis not present

## 2019-04-12 HISTORY — DX: Essential (primary) hypertension: I10

## 2019-04-12 LAB — BASIC METABOLIC PANEL
Anion gap: 9 (ref 5–15)
BUN: 16 mg/dL (ref 8–23)
CO2: 26 mmol/L (ref 22–32)
Calcium: 9.3 mg/dL (ref 8.9–10.3)
Chloride: 99 mmol/L (ref 98–111)
Creatinine, Ser: 1.02 mg/dL (ref 0.61–1.24)
GFR calc Af Amer: >60 mL/min (ref >60)
GFR calc non Af Amer: >60 mL/min (ref >60)
Glucose, Bld: 94 mg/dL (ref 70–99)
Potassium: 3.5 mmol/L (ref 3.5–5.1)
Sodium: 134 mmol/L — ABNORMAL LOW (ref 135–145)

## 2019-04-12 LAB — CBC WITH DIFFERENTIAL/PLATELET
Abs Immature Granulocytes: 0.03 10*3/uL (ref 0.00–0.07)
Basophils Absolute: 0.1 10*3/uL (ref 0.0–0.1)
Basophils Relative: 1 %
Eosinophils Absolute: 0.1 10*3/uL (ref 0.0–0.5)
Eosinophils Relative: 1 %
HCT: 43.9 % (ref 39.0–52.0)
Hemoglobin: 14.5 g/dL (ref 13.0–17.0)
Immature Granulocytes: 0 %
Lymphocytes Relative: 26 %
Lymphs Abs: 2.4 10*3/uL (ref 0.7–4.0)
MCH: 29.5 pg (ref 26.0–34.0)
MCHC: 33 g/dL (ref 30.0–36.0)
MCV: 89.2 fL (ref 80.0–100.0)
Monocytes Absolute: 0.8 10*3/uL (ref 0.1–1.0)
Monocytes Relative: 8 %
Neutro Abs: 5.7 10*3/uL (ref 1.7–7.7)
Neutrophils Relative %: 64 %
Platelets: 321 10*3/uL (ref 150–400)
RBC: 4.92 MIL/uL (ref 4.22–5.81)
RDW: 13.3 % (ref 11.5–15.5)
WBC: 9 10*3/uL (ref 4.0–10.5)
nRBC: 0 % (ref 0.0–0.2)

## 2019-04-12 NOTE — ED Triage Notes (Signed)
PT states this morning prior to going to work his blood pressure was 205/89 and when he rechecked it later on it was 172/90. PT denies any symptoms of high blood pressure and states he routinely takes his b/p for the [place] everyday.

## 2019-04-12 NOTE — Discharge Instructions (Signed)
Take your blood pressure medications as prescribed and follow-up with your doctor at the [place].  Keep a record of your blood pressure and check it on a regular basis.  Return to the ED develop headache, visual changes, chest pain, shortness of breath, any other concerns.

## 2019-04-12 NOTE — ED Provider Notes (Addendum)
[facility] EMERGENCY DEPARTMENT Provider Note   CSN: [id number] Arrival date & time: 04/12/19  1207     History   Chief Complaint Chief Complaint  Patient presents with  . Hypertension    HPI Kent Smith is a 68 y.o. male.     Patient here with elevated blood pressure.  States he checked his blood pressure this morning it was 205/90.  He believes he was not resting his arm before checking it.  He checked it again after work and found it to be still elevated.  Normally checks his blood pressure every day and keeps a record of it.  Normally is in the Q000111Q to Q000111Q systolic.  He states compliance with lisinopril denies any missed doses.  He states he was checking his blood pressure today and was feeling fine.  No dizziness, lightheadedness, chest pain, shortness of breath.  No headache or vision problems.  No focal weakness, numbness or tingling.  Has not missed any blood pressure medications recently.  The history is provided by the patient.  Hypertension Pertinent negatives include no chest pain, no abdominal pain, no headaches and no shortness of breath.    Past Medical History:  Diagnosis Date  . Hypertension     There are no active problems to display for this patient.   Past Surgical History:  Procedure Laterality Date  . HERNIA REPAIR          Home Medications    Prior to Admission medications   Medication Sig Start Date End Date Taking? Authorizing Provider  hydrochlorothiazide (HYDRODIURIL) 25 MG tablet Take 1 tablet (25 mg total) by mouth daily. 09/13/16   Varney Biles, MD  naproxen sodium (ANAPROX) 220 MG tablet Take 440 mg by mouth 2 (two) times daily as needed (for pain).    [provider]    Family History History reviewed. No pertinent family history.  Social History Social History   Tobacco Use  . Smoking status: Former Research scientist (life sciences)  . Smokeless tobacco: Never Used  Substance Use Topics  . Alcohol use: Yes    Comment: 2 or 3 beers  a week  . Drug use: No     Allergies   Patient has no known allergies.   Review of Systems Review of Systems  Constitutional: Negative for activity change, appetite change and fever.  HENT: Negative for congestion and rhinorrhea.   Eyes: Negative for pain and visual disturbance.  Respiratory: Negative for cough, chest tightness and shortness of breath.   Cardiovascular: Negative for chest pain.  Gastrointestinal: Negative for abdominal pain, nausea and vomiting.  Genitourinary: Negative for dysuria and hematuria.  Musculoskeletal: Negative for arthralgias and myalgias.  Skin: Negative for rash.  Neurological: Negative for dizziness, weakness, light-headedness and headaches.   all other systems are negative except as noted in the HPI and PMH.     Physical Exam Updated Vital Signs BP (!) 152/92 (BP Location: Left Arm)   Pulse 91   Temp 98.2 F (36.8 C) (Oral)   Resp 18   Ht 5\' 8"  (1.727 m)   Wt 91.6 kg   SpO2 98%   BMI 30.71 kg/m   Physical Exam Vitals signs and nursing note reviewed.  Constitutional:      General: He is not in acute distress.    Appearance: He is well-developed.  HENT:     Head: Normocephalic and atraumatic.     Mouth/Throat:     Pharynx: No oropharyngeal exudate.  Eyes:  Conjunctiva/sclera: Conjunctivae normal.     Pupils: Pupils are equal, round, and reactive to light.  Neck:     Musculoskeletal: Normal range of motion and neck supple.     Comments: No meningismus. Cardiovascular:     Rate and Rhythm: Normal rate and regular rhythm.     Heart sounds: Normal heart sounds. No murmur.  Pulmonary:     Effort: Pulmonary effort is normal. No respiratory distress.     Breath sounds: Normal breath sounds.  Chest:     Chest wall: No tenderness.  Abdominal:     Palpations: Abdomen is soft.     Tenderness: There is no abdominal tenderness. There is no guarding or rebound.  Musculoskeletal: Normal range of motion.        General: No  tenderness.  Skin:    General: Skin is warm.     Capillary Refill: Capillary refill takes less than 2 seconds.  Neurological:     General: No focal deficit present.     Mental Status: He is alert and oriented to person, place, and time. Mental status is at baseline.     Cranial Nerves: No cranial nerve deficit.     Motor: No abnormal muscle tone.     Coordination: Coordination normal.     Comments: No ataxia on finger to nose bilaterally. No pronator drift. 5/5 strength throughout. CN 2-12 intact.Equal grip strength. Sensation intact.   Psychiatric:        Behavior: Behavior normal.      ED Treatments / Results  Labs (all labs ordered are listed, but only abnormal results are displayed) Labs Reviewed  BASIC METABOLIC PANEL - Abnormal; Notable for the following components:      Result Value   Sodium 134 (*)    All other components within normal limits  CBC WITH DIFFERENTIAL/PLATELET    EKG EKG Interpretation  Date/Time:  Tuesday April 12 2019 15:15:11 EST Ventricular Rate:  77 PR Interval:  196 QRS Duration: 78 QT Interval:  370 QTC Calculation: 418 R Axis:   60 Text Interpretation: Normal sinus rhythm Normal ECG No significant change was found Confirmed by Ezequiel Essex [phone]) on 04/12/2019 3:23:13 PM   Radiology No results found.  Procedures Procedures (including critical care time)  Medications Ordered in ED Medications - No data to display   Initial Impression / Assessment and Plan / ED Course  I have reviewed the triage vital signs and the nursing notes.  Pertinent labs & imaging results that were available during my care of the patient were reviewed by me and considered in my medical decision making (see chart for details).       Patient here with asymptomatic hypertension.  States he was checking his blood pressure as he does every day.  No recent medication changes or missed doses.  No chest pain, shortness of breath, headache or visual  changes.  Nonfocal neurological exam.  Blood pressure has improved to 152/92.  Labs showed no evidence of endorgan damage.  Blood pressure is improved to 152/82.  No chest pain, shortness of breath, headache or visual changes.  Discussed with patient to keep a record of his blood pressure and follow-up with his doctors at the [place].  No indication for emergent lowering of blood pressure today. Return precautions discussed  Final Clinical Impressions(s) / ED Diagnoses   Final diagnoses:  Hypertension, unspecified type    ED Discharge Orders    None       Alesandro Stueve, Annie Main, MD 04/13/19  [id number]    Ezequiel Essex, MD 04/13/19 0104

## 2019-06-16 ENCOUNTER — Ambulatory Visit: Payer: No Typology Code available for payment source | Attending: Internal Medicine

## 2019-06-16 ENCOUNTER — Other Ambulatory Visit: Payer: Self-pay

## 2019-06-16 DIAGNOSIS — Z20822 Contact with and (suspected) exposure to covid-19: Secondary | ICD-10-CM

## 2019-06-17 LAB — NOVEL CORONAVIRUS, NAA: SARS-CoV-2, NAA: NOT DETECTED

## 2019-06-19 ENCOUNTER — Telehealth: Payer: Self-pay | Admitting: Internal Medicine

## 2019-06-19 NOTE — Telephone Encounter (Signed)
Negative COVID results given. Patient results "NOT Detected." Caller expressed understanding. ° °

## 2019-06-29 ENCOUNTER — Other Ambulatory Visit: Payer: Self-pay

## 2019-06-29 ENCOUNTER — Ambulatory Visit: Payer: No Typology Code available for payment source | Attending: Internal Medicine

## 2019-06-29 DIAGNOSIS — Z20822 Contact with and (suspected) exposure to covid-19: Secondary | ICD-10-CM

## 2019-06-30 ENCOUNTER — Telehealth: Payer: Self-pay | Admitting: *Deleted

## 2019-06-30 LAB — NOVEL CORONAVIRUS, NAA: SARS-CoV-2, NAA: NOT DETECTED

## 2019-06-30 NOTE — Telephone Encounter (Signed)
Pt called for COVID test results, informed that lab shows Not Detected. Questions answered, pt verbalizes understanding.

## 2019-09-09 DIAGNOSIS — I10 Essential (primary) hypertension: Secondary | ICD-10-CM | POA: Diagnosis not present

## 2019-09-13 DIAGNOSIS — E039 Hypothyroidism, unspecified: Secondary | ICD-10-CM | POA: Diagnosis not present

## 2019-09-13 DIAGNOSIS — Z125 Encounter for screening for malignant neoplasm of prostate: Secondary | ICD-10-CM | POA: Diagnosis not present

## 2019-09-13 DIAGNOSIS — E785 Hyperlipidemia, unspecified: Secondary | ICD-10-CM | POA: Diagnosis not present

## 2019-09-13 DIAGNOSIS — I10 Essential (primary) hypertension: Secondary | ICD-10-CM | POA: Diagnosis not present

## 2019-10-28 DIAGNOSIS — Z0001 Encounter for general adult medical examination with abnormal findings: Secondary | ICD-10-CM | POA: Diagnosis not present

## 2019-10-28 DIAGNOSIS — I10 Essential (primary) hypertension: Secondary | ICD-10-CM | POA: Diagnosis not present

## 2019-10-28 DIAGNOSIS — R972 Elevated prostate specific antigen [PSA]: Secondary | ICD-10-CM | POA: Diagnosis not present

## 2019-10-28 DIAGNOSIS — Z23 Encounter for immunization: Secondary | ICD-10-CM | POA: Diagnosis not present

## 2019-10-28 DIAGNOSIS — Z1331 Encounter for screening for depression: Secondary | ICD-10-CM | POA: Diagnosis not present

## 2019-10-28 DIAGNOSIS — E785 Hyperlipidemia, unspecified: Secondary | ICD-10-CM | POA: Diagnosis not present

## 2019-10-28 DIAGNOSIS — Z1389 Encounter for screening for other disorder: Secondary | ICD-10-CM | POA: Diagnosis not present

## 2019-11-17 DIAGNOSIS — Z8719 Personal history of other diseases of the digestive system: Secondary | ICD-10-CM

## 2019-11-17 HISTORY — DX: Personal history of other diseases of the digestive system: Z87.19

## 2019-11-27 DIAGNOSIS — I10 Essential (primary) hypertension: Secondary | ICD-10-CM | POA: Diagnosis not present

## 2019-11-27 DIAGNOSIS — E785 Hyperlipidemia, unspecified: Secondary | ICD-10-CM | POA: Diagnosis not present

## 2019-12-10 ENCOUNTER — Other Ambulatory Visit: Payer: Self-pay

## 2019-12-10 ENCOUNTER — Encounter (HOSPITAL_COMMUNITY): Payer: Self-pay

## 2019-12-10 ENCOUNTER — Emergency Department (HOSPITAL_COMMUNITY)
Admission: EM | Admit: 2019-12-10 | Discharge: 2019-12-10 | Disposition: A | Discharge status: Home / Self Care | Payer: Medicare HMO | Attending: Emergency Medicine | Admitting: Emergency Medicine

## 2019-12-10 DIAGNOSIS — I10 Essential (primary) hypertension: Secondary | ICD-10-CM | POA: Insufficient documentation

## 2019-12-10 DIAGNOSIS — Z20822 Contact with and (suspected) exposure to covid-19: Secondary | ICD-10-CM | POA: Diagnosis not present

## 2019-12-10 DIAGNOSIS — R109 Unspecified abdominal pain: Secondary | ICD-10-CM | POA: Insufficient documentation

## 2019-12-10 DIAGNOSIS — Z87891 Personal history of nicotine dependence: Secondary | ICD-10-CM | POA: Insufficient documentation

## 2019-12-10 DIAGNOSIS — R509 Fever, unspecified: Secondary | ICD-10-CM | POA: Diagnosis present

## 2019-12-10 DIAGNOSIS — K529 Noninfective gastroenteritis and colitis, unspecified: Secondary | ICD-10-CM | POA: Diagnosis not present

## 2019-12-10 DIAGNOSIS — Z79899 Other long term (current) drug therapy: Secondary | ICD-10-CM | POA: Insufficient documentation

## 2019-12-10 LAB — COMPREHENSIVE METABOLIC PANEL
ALT: 25 U/L (ref 0–44)
AST: 22 U/L (ref 15–41)
Albumin: 4.1 g/dL (ref 3.5–5.0)
Alkaline Phosphatase: 72 U/L (ref 38–126)
Anion gap: 10 (ref 5–15)
BUN: 18 mg/dL (ref 8–23)
CO2: 28 mmol/L (ref 22–32)
Calcium: 9.5 mg/dL (ref 8.9–10.3)
Chloride: 97 mmol/L — ABNORMAL LOW (ref 98–111)
Creatinine, Ser: 1.01 mg/dL (ref 0.61–1.24)
GFR calc Af Amer: >60 mL/min (ref >60)
GFR calc non Af Amer: >60 mL/min (ref >60)
Glucose, Bld: 92 mg/dL (ref 70–99)
Potassium: 3.7 mmol/L (ref 3.5–5.1)
Sodium: 135 mmol/L (ref 135–145)
Total Bilirubin: 0.5 mg/dL (ref 0.3–1.2)
Total Protein: 7.9 g/dL (ref 6.5–8.1)

## 2019-12-10 LAB — CBC WITH DIFFERENTIAL/PLATELET
Abs Immature Granulocytes: 0.02 10*3/uL (ref 0.00–0.07)
Basophils Absolute: 0.1 10*3/uL (ref 0.0–0.1)
Basophils Relative: 1 %
Eosinophils Absolute: 0.1 10*3/uL (ref 0.0–0.5)
Eosinophils Relative: 1 %
HCT: 44.4 % (ref 39.0–52.0)
Hemoglobin: 14.8 g/dL (ref 13.0–17.0)
Immature Granulocytes: 0 %
Lymphocytes Relative: 33 %
Lymphs Abs: 2.7 10*3/uL (ref 0.7–4.0)
MCH: 29.6 pg (ref 26.0–34.0)
MCHC: 33.3 g/dL (ref 30.0–36.0)
MCV: 88.8 fL (ref 80.0–100.0)
Monocytes Absolute: 0.8 10*3/uL (ref 0.1–1.0)
Monocytes Relative: 9 %
Neutro Abs: 4.8 10*3/uL (ref 1.7–7.7)
Neutrophils Relative %: 56 %
Platelets: 286 10*3/uL (ref 150–400)
RBC: 5 MIL/uL (ref 4.22–5.81)
RDW: 13.6 % (ref 11.5–15.5)
WBC: 8.4 10*3/uL (ref 4.0–10.5)
nRBC: 0 % (ref 0.0–0.2)

## 2019-12-10 LAB — SARS CORONAVIRUS 2 BY RT PCR (HOSPITAL ORDER, PERFORMED IN ~~LOC~~ HOSPITAL LAB): SARS Coronavirus 2: NEGATIVE

## 2019-12-10 MED ORDER — SODIUM CHLORIDE 0.9 % IV BOLUS
1000.0000 mL | Freq: Once | INTRAVENOUS | Status: AC
  Administered 2019-12-10: 1000 mL via INTRAVENOUS

## 2019-12-10 MED ORDER — CIPROFLOXACIN HCL 500 MG PO TABS: 500.0000 mg | ORAL_TABLET | Freq: Two times a day (BID) | ORAL | 0 refills | Status: DC

## 2019-12-10 NOTE — ED Provider Notes (Signed)
[facility] EMERGENCY DEPARTMENT Provider Note   CSN: [id number] Arrival date & time: 12/10/19  6967     History Chief Complaint  Patient presents with  . Fever  . Blood In [place] is a 69 y.o. male.  Patient complains of some fever and aches and bloody diarrhea.  But no abdominal pain.  The bloody diarrhea last was seen 3 days ago, mild nausea  The history is provided by the patient and medical records. A language interpreter was used.  Abdominal Pain Pain location:  Generalized Pain quality: aching   Pain radiates to:  Does not radiate Pain severity:  Mild Onset quality:  Sudden Timing:  Rare Progression:  Resolved Chronicity:  New Context: not alcohol use   Relieved by:  Nothing Ineffective treatments:  None tried Associated symptoms: diarrhea   Associated symptoms: no anorexia, no chest pain, no cough, no fatigue and no hematuria        Past Medical History:  Diagnosis Date  . Hypertension     There are no problems to display for this patient.   Past Surgical History:  Procedure Laterality Date  . HERNIA REPAIR         No family history on file.  Social History   Tobacco Use  . Smoking status: Former Research scientist (life sciences)  . Smokeless tobacco: Never Used  Vaping Use  . Vaping Use: Never used  Substance Use Topics  . Alcohol use: Yes    Comment: 2 or 3 beers a week  . Drug use: No    Home Medications Prior to Admission medications   Medication Sig Start Date End Date Taking? Authorizing Provider  ciprofloxacin (CIPRO) 500 MG tablet Take 1 tablet (500 mg total) by mouth 2 (two) times daily. One po bid x 7 days 12/10/19   Milton Ferguson, MD  hydrochlorothiazide (HYDRODIURIL) 25 MG tablet Take 1 tablet (25 mg total) by mouth daily. 09/13/16   Varney Biles, MD  naproxen sodium (ANAPROX) 220 MG tablet Take 440 mg by mouth 2 (two) times daily as needed (for pain).    [provider]    Allergies    Patient has no known  allergies.  Review of Systems   Review of Systems  Constitutional: Negative for appetite change and fatigue.  HENT: Negative for congestion, ear discharge and sinus pressure.   Eyes: Negative for discharge.  Respiratory: Negative for cough.   Cardiovascular: Negative for chest pain.  Gastrointestinal: Positive for abdominal pain and diarrhea. Negative for anorexia.  Genitourinary: Negative for frequency and hematuria.  Musculoskeletal: Negative for back pain.  Skin: Negative for rash.  Neurological: Negative for seizures and headaches.  Psychiatric/Behavioral: Negative for hallucinations.    Physical Exam Updated Vital Signs BP (!) 144/88   Pulse 62   Temp 99.1 F (37.3 C) (Oral)   Resp 16   Ht 5\' 8"  (1.727 m)   Wt 87.1 kg   SpO2 100%   BMI 29.19 kg/m   Physical Exam Vitals and nursing note reviewed.  Constitutional:      Appearance: He is well-developed.  HENT:     Head: Normocephalic.     Nose: Nose normal.  Eyes:     General: No scleral icterus.    Conjunctiva/sclera: Conjunctivae normal.  Neck:     Thyroid: No thyromegaly.  Cardiovascular:     Rate and Rhythm: Normal rate and regular rhythm.     Heart sounds: No murmur heard.  No friction rub. No  gallop.   Pulmonary:     Breath sounds: No stridor. No wheezing or rales.  Chest:     Chest wall: No tenderness.  Abdominal:     General: There is no distension.     Tenderness: There is no abdominal tenderness. There is no rebound.  Musculoskeletal:        General: Normal range of motion.     Cervical back: Neck supple.  Lymphadenopathy:     Cervical: No cervical adenopathy.  Skin:    Findings: No erythema or rash.  Neurological:     Mental Status: He is alert and oriented to person, place, and time.     Motor: No abnormal muscle tone.     Coordination: Coordination normal.  Psychiatric:        Behavior: Behavior normal.     ED Results / Procedures / Treatments   Labs (all labs ordered are listed,  but only abnormal results are displayed) Labs Reviewed  COMPREHENSIVE METABOLIC PANEL - Abnormal; Notable for the following components:      Result Value   Chloride 97 (*)    All other components within normal limits  SARS CORONAVIRUS 2 BY RT PCR (HOSPITAL ORDER, [facility])  CBC WITH DIFFERENTIAL/PLATELET    EKG None  Radiology No results found.  Procedures Procedures (including critical care time)  Medications Ordered in ED Medications  sodium chloride 0.9 % bolus 1,000 mL (1,000 mLs Intravenous New Bag/Given 12/10/19 1242)    ED Course  I have reviewed the triage vital signs and the nursing notes.  Pertinent labs & imaging results that were available during my care of the patient were reviewed by me and considered in my medical decision making (see chart for details).    MDM Rules/Calculators/A&P                          Labs unremarkable.  Patient will be treated for possible colitis and follow-up with PCP       This patient presents to the ED for concern of bloody diarrhea this involves an extensive number of treatment options, and is a complaint that carries with it a high risk of complications and morbidity.  The differential diagnosis includes colitis tumor diverticulitis   Lab Tests:   I Ordered, reviewed, and interpreted labs, which included CBC chemistries which were unremarkable  Medicines ordered:   I ordered medication normal saline for dehydration  Imaging Studies ordered:   Additional history obtained:   Additional history obtained from records  Previous records obtained and reviewed.  Consultations Obtained:     Reevaluation:  After the interventions stated above, I reevaluated the patient and found improved  Critical Interventions:  .   Final Clinical Impression(s) / ED Diagnoses Final diagnoses:  Colitis    Rx / DC Orders ED Discharge Orders         Ordered    ciprofloxacin (CIPRO) 500 MG  tablet  2 times daily     Discontinue  Reprint     12/10/19 1458           Milton Ferguson, MD 12/12/19 1641

## 2019-12-10 NOTE — Discharge Instructions (Addendum)
Follow up with your md next week. °

## 2019-12-10 NOTE — ED Triage Notes (Signed)
Pt reports that he has been experiencing fever and chills at home for since Monday. Mild HA. Pt reports 3 days he had red blood in stool. Denies pain.

## 2019-12-16 ENCOUNTER — Encounter (HOSPITAL_COMMUNITY): Payer: Self-pay | Admitting: *Deleted

## 2019-12-16 ENCOUNTER — Other Ambulatory Visit: Payer: Self-pay

## 2019-12-16 ENCOUNTER — Emergency Department (HOSPITAL_COMMUNITY)
Admission: EM | Admit: 2019-12-16 | Discharge: 2019-12-16 | Disposition: A | Discharge status: Home / Self Care | Payer: No Typology Code available for payment source | Attending: Emergency Medicine | Admitting: Emergency Medicine

## 2019-12-16 DIAGNOSIS — E876 Hypokalemia: Secondary | ICD-10-CM | POA: Diagnosis not present

## 2019-12-16 DIAGNOSIS — R42 Dizziness and giddiness: Secondary | ICD-10-CM | POA: Diagnosis present

## 2019-12-16 DIAGNOSIS — I10 Essential (primary) hypertension: Secondary | ICD-10-CM | POA: Insufficient documentation

## 2019-12-16 DIAGNOSIS — Z87891 Personal history of nicotine dependence: Secondary | ICD-10-CM | POA: Insufficient documentation

## 2019-12-16 LAB — BASIC METABOLIC PANEL
Anion gap: 10 (ref 5–15)
BUN: 19 mg/dL (ref 8–23)
CO2: 27 mmol/L (ref 22–32)
Calcium: 9 mg/dL (ref 8.9–10.3)
Chloride: 101 mmol/L (ref 98–111)
Creatinine, Ser: 1.18 mg/dL (ref 0.61–1.24)
GFR calc Af Amer: >60 mL/min (ref >60)
GFR calc non Af Amer: >60 mL/min (ref >60)
Glucose, Bld: 96 mg/dL (ref 70–99)
Potassium: 3.3 mmol/L — ABNORMAL LOW (ref 3.5–5.1)
Sodium: 138 mmol/L (ref 135–145)

## 2019-12-16 LAB — CBC WITH DIFFERENTIAL/PLATELET
Abs Immature Granulocytes: 0.01 10*3/uL (ref 0.00–0.07)
Basophils Absolute: 0.1 10*3/uL (ref 0.0–0.1)
Basophils Relative: 1 %
Eosinophils Absolute: 0.1 10*3/uL (ref 0.0–0.5)
Eosinophils Relative: 2 %
HCT: 38.7 % — ABNORMAL LOW (ref 39.0–52.0)
Hemoglobin: 13.3 g/dL (ref 13.0–17.0)
Immature Granulocytes: 0 %
Lymphocytes Relative: 28 %
Lymphs Abs: 2.1 10*3/uL (ref 0.7–4.0)
MCH: 30.1 pg (ref 26.0–34.0)
MCHC: 34.4 g/dL (ref 30.0–36.0)
MCV: 87.6 fL (ref 80.0–100.0)
Monocytes Absolute: 0.7 10*3/uL (ref 0.1–1.0)
Monocytes Relative: 10 %
Neutro Abs: 4.7 10*3/uL (ref 1.7–7.7)
Neutrophils Relative %: 59 %
Platelets: 270 10*3/uL (ref 150–400)
RBC: 4.42 MIL/uL (ref 4.22–5.81)
RDW: 13.5 % (ref 11.5–15.5)
WBC: 7.7 10*3/uL (ref 4.0–10.5)
nRBC: 0 % (ref 0.0–0.2)

## 2019-12-16 MED ORDER — POTASSIUM CHLORIDE CRYS ER 20 MEQ PO TBCR: 20.0000 meq | EXTENDED_RELEASE_TABLET | Freq: Every day | ORAL | 0 refills | Status: DC

## 2019-12-16 NOTE — ED Provider Notes (Signed)
Kent Smith Provider Note   CSN: [id number] Arrival date & time: 12/16/19  1716     History Chief Complaint  Patient presents with  . Follow-up    Kent Smith is a 69 y.o. male who presents with concern for drug reaction. The patient was recently diagnosed with colitis.  He was prescribed Cipro 500 mg twice daily but has only been taking it once daily because he was afraid he might mix it with his blood pressure medications might cause a problem.  Patient states that his bloody diarrhea has resolved.  He does have follow-up appointment with gastroenterology.  Patient states that over the past few days in the evening times he has episodes of Feeling hot and weak. These episodes self resolve rather quickly. He denies chest pain, nausea, sob, diaphoresis. He sometimes feels lightheaded but denies syncope or palpitations. The episodes occur more frequently after coming into to the house in the evening from the garden.   HPI     Past Medical History:  Diagnosis Date  . Hypertension     There are no problems to display for this patient.   Past Surgical History:  Procedure Laterality Date  . HERNIA REPAIR         No family history on file.  Social History   Tobacco Use  . Smoking status: Former Research scientist (life sciences)  . Smokeless tobacco: Never Used  Vaping Use  . Vaping Use: Never used  Substance Use Topics  . Alcohol use: Yes    Comment: 2 or 3 beers a week  . Drug use: No    Home Medications Prior to Admission medications   Medication Sig Start Date End Date Taking? Authorizing Provider  atorvastatin (LIPITOR) 10 MG tablet Take 10 mg by mouth daily. 10/28/19  Yes [provider]  ciprofloxacin (CIPRO) 500 MG tablet Take 1 tablet (500 mg total) by mouth 2 (two) times daily. One po bid x 7 days 12/10/19  Yes Milton Ferguson, MD  losartan-hydrochlorothiazide (HYZAAR) 100-25 MG tablet Take 1 tablet by mouth daily. 11/29/19  Yes [provider]   naproxen sodium (ANAPROX) 220 MG tablet Take 440 mg by mouth 2 (two) times daily as needed (for pain).   Yes [provider]  hydrochlorothiazide (HYDRODIURIL) 25 MG tablet Take 1 tablet (25 mg total) by mouth daily. Patient not taking: Reported on 12/16/2019 09/13/16   Varney Biles, MD    Allergies    Patient has no known allergies.  Review of Systems   Review of Systems Ten systems reviewed and are negative for acute change, except as noted in the HPI. \ Physical Exam Updated Vital Signs BP (!) 165/90   Pulse 92   Temp 98.7 F (37.1 C)   Resp 18   SpO2 99%   Physical Exam Vitals and nursing note reviewed.  Constitutional:      General: He is not in acute distress.    Appearance: He is well-developed. He is not diaphoretic.  HENT:     Head: Normocephalic and atraumatic.  Eyes:     General: No scleral icterus.    Conjunctiva/sclera: Conjunctivae normal.  Cardiovascular:     Rate and Rhythm: Normal rate and regular rhythm.     Heart sounds: Normal heart sounds. No murmur heard.   Pulmonary:     Effort: Pulmonary effort is normal. No respiratory distress.     Breath sounds: Normal breath sounds.  Abdominal:     Palpations: Abdomen is soft.  Tenderness: There is no abdominal tenderness.  Musculoskeletal:     Cervical back: Normal range of motion and neck supple.  Skin:    General: Skin is warm and dry.  Neurological:     General: No focal deficit present.     Mental Status: He is alert and oriented to person, place, and time.  Psychiatric:        Behavior: Behavior normal.     ED Results / Procedures / Treatments   Labs (all labs ordered are listed, but only abnormal results are displayed) Labs Reviewed  BASIC METABOLIC PANEL  CBC WITH DIFFERENTIAL/PLATELET    EKG None  Radiology No results found.  Procedures Procedures (including critical care time)  Medications Ordered in ED Medications - No data to display  ED Course  I have  reviewed the triage vital signs and the nursing notes.  Pertinent labs & imaging results that were available during my care of the patient were reviewed by me and considered in my medical decision making (see chart for details).    MDM Rules/Calculators/A&P                          Patient here with complaint of warm feeling and lightheadedness.  I ordered and reviewed patient's labs which shows mild hypokalemia, no abnormalities otherwise.  Patient's symptoms are fleeting and occur usually in the evenings.  He has had no symptoms today.  Do not believe that this is a drug reaction.  He is encouraged to take his Cipro as directed.  He appears otherwise appropriate for discharge at this time.  Will discharge with oral potassium for next 3 days. Final Clinical Impression(s) / ED Diagnoses Final diagnoses:  None    Rx / DC Orders ED Discharge Orders    None       Margarita Mail, PA-C 12/16/19 2219    Dorie Rank, MD 12/19/19 1048

## 2019-12-16 NOTE — ED Triage Notes (Signed)
States he may be having a reaction to medication he was prescribed last week, feel like something isn't right

## 2019-12-16 NOTE — Discharge Instructions (Addendum)
Contact a health care provider if you: Have weakness that gets worse. Feel your heart pounding or racing. Vomit. Have diarrhea. Have diabetes (diabetes mellitus) and you have trouble keeping your blood sugar (glucose) in your target range. Get help right away if you: Have chest pain. Have shortness of breath. Have vomiting or diarrhea that lasts for more than 2 days. Faint.

## 2019-12-20 ENCOUNTER — Ambulatory Visit (INDEPENDENT_AMBULATORY_CARE_PROVIDER_SITE_OTHER): Payer: No Typology Code available for payment source | Admitting: Urology

## 2019-12-20 ENCOUNTER — Encounter: Payer: Self-pay | Admitting: Urology

## 2019-12-20 VITALS — BP 159/95 | HR 75 | Temp 98.1°F | Ht 68.0 in | Wt 192.0 lb

## 2019-12-20 DIAGNOSIS — R972 Elevated prostate specific antigen [PSA]: Secondary | ICD-10-CM

## 2019-12-20 LAB — URINALYSIS, ROUTINE W REFLEX MICROSCOPIC
Bilirubin, UA: NEGATIVE
Glucose, UA: NEGATIVE
Ketones, UA: NEGATIVE
Leukocytes,UA: NEGATIVE
Nitrite, UA: NEGATIVE
Protein,UA: NEGATIVE
Specific Gravity, UA: 1.02 (ref 1.005–1.030)
Urobilinogen, Ur: 0.2 mg/dL (ref 0.2–1.0)
pH, UA: 7 (ref 5.0–7.5)

## 2019-12-20 LAB — MICROSCOPIC EXAMINATION
Bacteria, UA: NONE SEEN
Epithelial Cells (non renal): NONE SEEN /hpf (ref 0–10)
Renal Epithel, UA: NONE SEEN /hpf
WBC, UA: NONE SEEN /hpf (ref 0–5)

## 2019-12-20 NOTE — Progress Notes (Signed)
H&P  Chief Complaint: Elevated PSA, BPH w/ LUTS  History of Present Illness:  Pt recently received elevated PSA results (5.3). He is here for his 1st GU evaluation.  Pt denies family h/o prostate cancer. Pt denies any recent UTIs. Pt uncertain whether he has been diagnosed with BPH in the past.  IPSS Questionnaire (AUA-7): Over the past month.   1)  How often have you had a sensation of not emptying your bladder completely after you finish urinating?  4 - More than half the time  2)  How often have you had to urinate again less than two hours after you finished urinating? 4 - More than half the time  3)  How often have you found you stopped and started again several times when you urinated?  2 - Less than half the time  4) How difficult have you found it to postpone urination?  3 - About half the time  5) How often have you had a weak urinary stream?  1 - Less than 1 time in 5  6) How often have you had to push or strain to begin urination?  2 - Less than half the time  7) How many times did you most typically get up to urinate from the time you went to bed until the time you got up in the morning?  3 - 3 times  Total score:  0-7 mildly symptomatic   8-19 moderately symptomatic   20-35 severely symptomatic     Past Medical History:  Diagnosis Date  . Hypertension     Past Surgical History:  Procedure Laterality Date  . HERNIA REPAIR      Home Medications:  Allergies as of 12/20/2019   No Known Allergies     Medication List       Accurate as of December 20, 2019 10:36 AM. If you have any questions, ask your nurse or doctor.        atorvastatin 10 MG tablet Commonly known as: LIPITOR Take 10 mg by mouth daily.   ciprofloxacin 500 MG tablet Commonly known as: Cipro Take 1 tablet (500 mg total) by mouth 2 (two) times daily. One po bid x 7 days   hydrochlorothiazide 25 MG tablet Commonly known as: HYDRODIURIL Take 1 tablet (25 mg total) by mouth daily.     losartan-hydrochlorothiazide 100-25 MG tablet Commonly known as: HYZAAR Take 1 tablet by mouth daily.   naproxen sodium 220 MG tablet Commonly known as: ALEVE Take 440 mg by mouth 2 (two) times daily as needed (for pain).   potassium chloride SA 20 MEQ tablet Commonly known as: KLOR-CON Take 1 tablet (20 mEq total) by mouth daily.       Allergies: No Known Allergies  No family history on file.  Social History:  reports that he has quit smoking. He has never used smokeless tobacco. He reports current alcohol use. He reports that he does not use drugs.  ROS: A complete review of systems was performed.  All systems are negative except for pertinent findings as noted.  Physical Exam:  Vital signs in last 24 hours: There were no vitals taken for this visit. Constitutional:  Alert and oriented, No acute distress Respiratory: Normal respiratory effort GI: Abdomen is soft, nontender, nondistended, no abdominal masses. No CVAT. No hernia. Genitourinary: Uncircumcised male phallus, testes are descended bilaterally and non-tender and without masses, scrotum is normal in appearance without lesions or masses, perineum is normal on inspection. Prostate: 40 grams Neurologic: Grossly  intact, no focal deficits Psychiatric: Normal mood and affect  I have reviewed prior pt notes  I have reviewed notes from referring/previous physicians  I have reviewed urinalysis results  I have reviewed prior PSA results  Impression/Assessment:  Evaded PSA, 24.50 in a 69 year old male with no family history of prostate cancer and a benign exam  Plan:  1. Will call to f/u w/ pt depending on PSA results today.  2. Causative factors of PSA elevation discussed   CC: Dr. Legrand Rams

## 2019-12-20 NOTE — Progress Notes (Signed)

## 2019-12-21 LAB — PSA: Prostate Specific Ag, Serum: 6.8 ng/mL — ABNORMAL HIGH (ref 0.0–4.0)

## 2019-12-22 ENCOUNTER — Other Ambulatory Visit: Payer: Self-pay | Admitting: Urology

## 2019-12-22 ENCOUNTER — Telehealth: Payer: Self-pay

## 2019-12-22 DIAGNOSIS — R972 Elevated prostate specific antigen [PSA]: Secondary | ICD-10-CM

## 2019-12-22 MED ORDER — LEVOFLOXACIN 750 MG PO TABS: 750.0000 mg | ORAL_TABLET | Freq: Every day | ORAL | 0 refills | Status: AC

## 2019-12-22 NOTE — Telephone Encounter (Addendum)
Message left to return call to office.  Biopsy and follow up appt made and sent via mail along with biopsy instructions.

## 2019-12-22 NOTE — Telephone Encounter (Signed)
Spoke with pt and notified of PSA rising and suggestion for biopsy. Pt chose to do Biopsy.

## 2019-12-22 NOTE — Telephone Encounter (Signed)
-----   Message from Franchot Gallo, MD sent at 12/22/2019 11:52 AM EDT ----- Notify patient that PSA is up to over 6.  I would recommend ultrasound and biopsy of the prostate.  Please schedule.  I put orders in. ----- Message ----- From: Iris Pert, LPN Sent: 07/23/6281   8:01 AM EDT To: Franchot Gallo, MD  Please review

## 2019-12-22 NOTE — Telephone Encounter (Signed)
-----   Message from Franchot Gallo, MD sent at 12/22/2019 11:52 AM EDT ----- Notify patient that PSA is up to over 6.  I would recommend ultrasound and biopsy of the prostate.  Please schedule.  I put orders in. ----- Message ----- From: Iris Pert, LPN Sent: 07/21/2874   8:01 AM EDT To: Franchot Gallo, MD  Please review

## 2019-12-28 DIAGNOSIS — R972 Elevated prostate specific antigen [PSA]: Secondary | ICD-10-CM | POA: Diagnosis not present

## 2019-12-28 DIAGNOSIS — I10 Essential (primary) hypertension: Secondary | ICD-10-CM | POA: Diagnosis not present

## 2019-12-29 ENCOUNTER — Encounter: Payer: Self-pay | Admitting: Internal Medicine

## 2020-01-11 DIAGNOSIS — I10 Essential (primary) hypertension: Secondary | ICD-10-CM | POA: Diagnosis not present

## 2020-01-11 DIAGNOSIS — E785 Hyperlipidemia, unspecified: Secondary | ICD-10-CM | POA: Diagnosis not present

## 2020-01-16 NOTE — Procedures (Signed)
Risks, benefits, and some of the potential complications of a transrectal ultrasounds of the prostate (TRUSP) with biopsies were discussed at length with the patient including gross hematuria, blood in the bowel movements, hematospermia, bacteremia, infection, voiding discomfort, urinary retention, fever, chills, sepsis, blood transfusion, death, and others. All questions were answered. Informed consent was obtained. The patient confirmed that he had taken his pre-procedure antibiotic. All anticoagulants were discontinued prior to the procedure. The patient emptied his bladder. He was positioned in a comfortable left lateral decubitus position with hips and knees acutely flexed.  The rectal probe was inserted into the rectum without difficulty. 10cc of 2% Lidocaine without epinephrine was instilled with a spinal needle using ultrasound guidance near the junction of each seminal vesicle and the prostate.  Sequential transverse (axial) scans were made in small increments beginning at the seminal vesicles and ending at the prostatic apex. Sequential longitudinal (saggital) scans were made in small increments beginning at the right lateral prostate and ending at the left lateral prostate. Excellent anatomical imaging was obtained. The peripheral, transitional, and central zones were well-defined. The seminal vesicles were normal.  Prostate volume 27.4 ml.  There were no hypoechoic areas. 12 needle core biopsies were performed. 1 biopsy each was taken from the following areas:  Right lateral base, right medial base, right lateral mid prostate, right medial mid prostate, right lateral apical prostate, right medial apical prostate, left lateral base, left medial base, left lateral mid prostate, left medial mid prostate, left lateral apical prostate, left medial apical prostate.. Minimal prostatic calcifications were noted. Excellent biopsy specimens were obtained.  Follow-up rectal examination was unremarkable.  The procedure was well-tolerated and without complications. Antibiotic instructions were given. The patient was told that:  For several days:  he should increase his fluid intake and limit strenuous activity  he might have mild discomfort at the base of his penis or in his rectum  he might have blood in his urine or blood in his bowel movements  For 2-3 months:  he might have blood in his ejaculate (semen)  Instructions were given to call the office immedicately for blood clots in the urine or bowel movements, difficulty urinating, inability to urinate, urinary retention, painful or frequent urination, fever, chills, nausea, vomiting, or other illness. The patient stated that he understood these instructions and would comply with them. We told the patient that prostate biopsy pathology reports are usually available within 3-5 working days, unless a pathologic second opinion is required, which may take 7-14 days. We told him to contact us to check on the status of his biopsy if he has not heard from [place] within 7 days. The patient left the ultrasound examination room in stable condition.

## 2020-01-17 ENCOUNTER — Other Ambulatory Visit: Payer: Self-pay

## 2020-01-17 ENCOUNTER — Other Ambulatory Visit: Payer: Self-pay | Admitting: Urology

## 2020-01-17 ENCOUNTER — Encounter (HOSPITAL_COMMUNITY): Payer: Self-pay

## 2020-01-17 ENCOUNTER — Ambulatory Visit (HOSPITAL_COMMUNITY)
Admission: RE | Admit: 2020-01-17 | Discharge: 2020-01-17 | Disposition: A | Discharge status: Home / Self Care | Payer: Medicare HMO | Source: Ambulatory Visit | Attending: Urology | Admitting: Urology

## 2020-01-17 ENCOUNTER — Ambulatory Visit (INDEPENDENT_AMBULATORY_CARE_PROVIDER_SITE_OTHER): Payer: No Typology Code available for payment source | Admitting: Urology

## 2020-01-17 DIAGNOSIS — R972 Elevated prostate specific antigen [PSA]: Secondary | ICD-10-CM | POA: Diagnosis not present

## 2020-01-17 DIAGNOSIS — C61 Malignant neoplasm of prostate: Secondary | ICD-10-CM | POA: Diagnosis not present

## 2020-01-17 MED ORDER — GENTAMICIN SULFATE 40 MG/ML IJ SOLN
INTRAMUSCULAR | Status: AC
  Filled 2020-01-17: qty 4

## 2020-01-17 MED ORDER — LIDOCAINE HCL (PF) 2 % IJ SOLN
INTRAMUSCULAR | Status: AC
  Filled 2020-01-17: qty 10

## 2020-01-17 MED ORDER — GENTAMICIN SULFATE 40 MG/ML IJ SOLN
80.0000 mg | Freq: Once | INTRAMUSCULAR | Status: AC
  Administered 2020-01-17: 80 mg via INTRAMUSCULAR

## 2020-01-17 NOTE — Sedation Documentation (Signed)
Pt tolerated procedure good.  VSS.  D/C instructions discussed with pt and he verbalized understanding.

## 2020-01-25 DIAGNOSIS — Z0100 Encounter for examination of eyes and vision without abnormal findings: Secondary | ICD-10-CM | POA: Diagnosis not present

## 2020-01-25 DIAGNOSIS — H5200 Hypermetropia, unspecified eye: Secondary | ICD-10-CM | POA: Diagnosis not present

## 2020-01-31 ENCOUNTER — Ambulatory Visit (INDEPENDENT_AMBULATORY_CARE_PROVIDER_SITE_OTHER): Payer: No Typology Code available for payment source | Admitting: Urology

## 2020-01-31 DIAGNOSIS — C61 Malignant neoplasm of prostate: Secondary | ICD-10-CM

## 2020-01-31 NOTE — Progress Notes (Signed)
Pt did not show for appt

## 2020-02-01 ENCOUNTER — Other Ambulatory Visit: Payer: Self-pay

## 2020-02-01 ENCOUNTER — Ambulatory Visit (INDEPENDENT_AMBULATORY_CARE_PROVIDER_SITE_OTHER): Payer: Medicare HMO | Admitting: Internal Medicine

## 2020-02-01 ENCOUNTER — Encounter: Payer: Self-pay | Admitting: Internal Medicine

## 2020-02-01 ENCOUNTER — Telehealth: Payer: Self-pay

## 2020-02-01 DIAGNOSIS — R195 Other fecal abnormalities: Secondary | ICD-10-CM | POA: Diagnosis not present

## 2020-02-01 NOTE — Progress Notes (Signed)
Primary Care Physician:  Rosita Fire, MD Primary Gastroenterologist:  Dr. Abbey Chatters  Chief Complaint  Patient presents with  . + FIT test    no blood in stool since July 2021. Thinks last colonoscopy was last done at [place] ? 5 years ago  . coitis ED visit    11/2019    HPI:   Kent Smith is a 69 y.o. male who presents to the clinic today by referral from his PCP Dr. Legrand Rams for evaluation.  Patient recently underwent FIT testing which was positive.  Denies any gross melena or hematochezia.  Believes he had a colonoscopy many years ago but though I do not have access to this report.  No family history of colorectal malignancy.  No unintentional weight loss.  Denies any reflux or heartburn.  No overt dysphagia or odynophagia.  No regurgitation of partially foods.  Denies any chest pain, shortness of breath.  Otherwise he has no other complaints.  Past Medical History:  Diagnosis Date  . Hypercholesteremia   . Hypertension     Past Surgical History:  Procedure Laterality Date  . HERNIA REPAIR      Current Outpatient Medications  Medication Sig Dispense Refill  . amLODipine (NORVASC) 5 MG tablet Take 5 mg by mouth daily.    Marland Kitchen atorvastatin (LIPITOR) 10 MG tablet Take 10 mg by mouth daily.    . ciprofloxacin (CIPRO) 500 MG tablet Take 1 tablet (500 mg total) by mouth 2 (two) times daily. One po bid x 7 days (Patient not taking: Reported on 02/01/2020) 14 tablet 0  . hydrochlorothiazide (HYDRODIURIL) 25 MG tablet Take 1 tablet (25 mg total) by mouth daily. (Patient not taking: Reported on 12/16/2019) 60 tablet 1  . losartan-hydrochlorothiazide (HYZAAR) 100-25 MG tablet Take 1 tablet by mouth daily. (Patient not taking: Reported on 12/20/2019)    . naproxen sodium (ANAPROX) 220 MG tablet Take 440 mg by mouth 2 (two) times daily as needed (for pain). (Patient not taking: Reported on 12/20/2019)    . potassium chloride SA (KLOR-CON) 20 MEQ tablet Take 1 tablet (20 mEq total) by mouth daily.  (Patient not taking: Reported on 02/01/2020) 3 tablet 0   No current facility-administered medications for this visit.    Allergies as of 02/01/2020  . (No Known Allergies)    Family History  Problem Relation Age of Onset  . Hypertension Father   . CAD Father     Social History   Socioeconomic History  . Marital status: Widowed    Spouse name: Not on file  . Number of children: Not on file  . Years of education: Not on file  . Highest education level: Not on file  Occupational History  . Not on file  Tobacco Use  . Smoking status: Former Research scientist (life sciences)  . Smokeless tobacco: Never Used  Vaping Use  . Vaping Use: Never used  Substance and Sexual Activity  . Alcohol use: Yes    Comment: 2 or 3 beers a week  . Drug use: No  . Sexual activity: Not on file  Other Topics Concern  . Not on file  Social History Narrative  . Not on file   Social Determinants of Health   Financial Resource Strain:   . Difficulty of Paying Living Expenses: Not on file  Food Insecurity:   . Worried About Charity fundraiser in the Last Year: Not on file  . Ran Out of Food in the Last Year: Not on file  Transportation Needs:   . Film/video editor (Medical): Not on file  . Lack of Transportation (Non-Medical): Not on file  Physical Activity:   . Days of Exercise per Week: Not on file  . Minutes of Exercise per Session: Not on file  Stress:   . Feeling of Stress : Not on file  Social Connections:   . Frequency of Communication with Friends and Family: Not on file  . Frequency of Social Gatherings with Friends and Family: Not on file  . Attends Religious Services: Not on file  . Active Member of Clubs or Organizations: Not on file  . Attends Archivist Meetings: Not on file  . Marital Status: Not on file  Intimate Partner Violence:   . Fear of Current or Ex-Partner: Not on file  . Emotionally Abused: Not on file  . Physically Abused: Not on file  . Sexually Abused: Not on file      Subjective: ROS     Objective: BP 139/89   Pulse 87   Temp (!) 97.5 F (36.4 C) (Oral)   Ht 5\' 8"  (1.727 m)   Wt 191 lb 9.6 oz (86.9 kg)   BMI 29.13 kg/m  Physical Exam   Assessment: *Positive FIT testing  Plan: Will schedule for  diagnostic colonoscopy.The risks including infection, bleed, or perforation as well as benefits, limitations, alternatives and imponderables have been reviewed with the patient. Questions have been answered. All parties agreeable.  Further recommendations to follow.  Thank you Dr. Legrand Rams for the kind referral.   02/01/2020 5:00 PM   Disclaimer: This note was dictated with voice recognition software. Similar sounding words can inadvertently be transcribed and may not be corrected upon review.

## 2020-02-01 NOTE — Patient Instructions (Signed)
We will schedule your colonoscopy today to further evaluate your positive FIT testing.  Further recommendations to follow.  At [facility] we value your feedback. You may receive a survey about your visit today. Please share your experience as we strive to create trusting relationships with our patients to provide genuine, compassionate, quality care.  We appreciate your understanding and patience as we review any laboratory studies, imaging, and other diagnostic tests that are ordered as we care for you. Our office policy is 5 business days for review of these results, and any emergent or urgent results are addressed in a timely manner for your best interest. If you do not hear from our office in 1 week, please contact us.   We also encourage the use of MyChart, which contains your medical information for your review as well. If you are not enrolled in this feature, an access code is on this after visit summary for your convenience. Thank you for allowing [place] to be involved in your care.  It was great to see you today!  I hope you have a great rest of your summer!!    Kent Smith. Abbey Chatters, D.O. Gastroenterology and Hepatology [facility]

## 2020-02-02 ENCOUNTER — Encounter: Payer: Self-pay | Admitting: *Deleted

## 2020-02-02 ENCOUNTER — Telehealth: Payer: Self-pay | Admitting: *Deleted

## 2020-02-02 NOTE — Telephone Encounter (Signed)
Called pt. He has been scheduled for procedure TCS with propofol, Dr. Abbey Chatters for 11/2 at 1:00pm. Patient aware will mail prep instructions with pre-op/covid tedt appt. Confirmed mailing address.

## 2020-02-03 NOTE — Telephone Encounter (Signed)
Opened in error

## 2020-02-11 DIAGNOSIS — I10 Essential (primary) hypertension: Secondary | ICD-10-CM | POA: Diagnosis not present

## 2020-02-11 DIAGNOSIS — E785 Hyperlipidemia, unspecified: Secondary | ICD-10-CM | POA: Diagnosis not present

## 2020-03-06 ENCOUNTER — Encounter: Payer: Self-pay | Admitting: Urology

## 2020-03-06 ENCOUNTER — Ambulatory Visit (INDEPENDENT_AMBULATORY_CARE_PROVIDER_SITE_OTHER): Payer: Medicare HMO | Admitting: Urology

## 2020-03-06 ENCOUNTER — Other Ambulatory Visit: Payer: Self-pay

## 2020-03-06 DIAGNOSIS — C61 Malignant neoplasm of prostate: Secondary | ICD-10-CM | POA: Diagnosis not present

## 2020-03-06 NOTE — Progress Notes (Signed)
See my other note

## 2020-03-06 NOTE — Progress Notes (Signed)
H&P  Chief Complaint: New Dx of PCa  History of Present Illness:  69 year old male presents today for prostate cancer conference.  He underwent ultrasound and biopsy of his prostate on 8.31.2021.  At that time, PSA 5.3, prostate volume 27 mL, PSA density 0.20.  2/12 cores revealed adenocarcinoma-left mid medial core GS 3+4 and 40% of core, left apex medial, GS 3+4 in 40% of sample.  He has minimal LUTS.   Past Medical History:  Diagnosis Date  . Hypercholesteremia   . Hypertension     Past Surgical History:  Procedure Laterality Date  . HERNIA REPAIR      Home Medications:  Allergies as of 03/06/2020   No Known Allergies     Medication List       Accurate as of March 06, 2020 12:05 PM. If you have any questions, ask your nurse or doctor.        amLODipine 5 MG tablet Commonly known as: NORVASC Take 5 mg by mouth daily.   atorvastatin 10 MG tablet Commonly known as: LIPITOR Take 10 mg by mouth daily.   ciprofloxacin 500 MG tablet Commonly known as: Cipro Take 1 tablet (500 mg total) by mouth 2 (two) times daily. One po bid x 7 days   hydrochlorothiazide 25 MG tablet Commonly known as: HYDRODIURIL Take 1 tablet (25 mg total) by mouth daily.   losartan-hydrochlorothiazide 100-25 MG tablet Commonly known as: HYZAAR Take 1 tablet by mouth daily.   naproxen sodium 220 MG tablet Commonly known as: ALEVE Take 440 mg by mouth 2 (two) times daily as needed (for pain).   potassium chloride SA 20 MEQ tablet Commonly known as: KLOR-CON Take 1 tablet (20 mEq total) by mouth daily.       Allergies: No Known Allergies  Family History  Problem Relation Age of Onset  . Hypertension Father   . CAD Father     Social History:  reports that he has quit smoking. He has never used smokeless tobacco. He reports current alcohol use. He reports that he does not use drugs.  ROS: A complete review of systems was performed.  All systems are negative except for  pertinent findings as noted.  Physical Exam:  Vital signs in last 24 hours: There were no vitals taken for this visit. Constitutional:  Alert and oriented, No acute distress Cardiovascular: Regular rate  Respiratory: Normal respiratory effort Lymphatic: No lymphadenopathy Neurologic: Grossly intact, no focal deficits Psychiatric: Normal mood and affect  I have reviewed prior pt notes  I have reviewed notes from referring/previous physicians  I have reviewed urinalysis results  I have independently reviewed prior imaging  I have reviewed prior PSA results   Impression/Assessment:  New diagnosis of prostate cancer, favorable intermediate risk category.  Plan:  The patient was counseled about the natural history of prostate cancer and the standard treatment options that are available for prostate cancer. It was explained to him how his age and life expectancy, clinical stage, Gleason score, and PSA affect his prognosis, the decision to proceed with additional staging studies, as well as how that information influences recommended treatment strategies. We discussed the roles for active surveillance, radiation therapy, surgical therapy, androgen deprivation, as well as ablative therapy options for the treatment of prostate cancer as appropriate to his individual cancer situation. We discussed the risks and benefits of these options with regard to their impact on cancer control and also in terms of potential adverse events, complications, and impact on quality of life  particularly related to urinary, bowel, and sexual function. The patient was encouraged to ask questions throughout the discussion today and all questions were answered to his stated satisfaction. In addition, the patient was provided with and/or directed to appropriate resources and literature for further education about prostate cancer and treatment options.  He is most interested in brachytherapy and I feel is a good candidate  for that. I'll set him up for consultation w/ Dr Tammi Klippel  35 minutes spent face to face in consultation with the pt

## 2020-03-12 DIAGNOSIS — R972 Elevated prostate specific antigen [PSA]: Secondary | ICD-10-CM | POA: Diagnosis not present

## 2020-03-12 DIAGNOSIS — I10 Essential (primary) hypertension: Secondary | ICD-10-CM | POA: Diagnosis not present

## 2020-03-12 DIAGNOSIS — E785 Hyperlipidemia, unspecified: Secondary | ICD-10-CM | POA: Diagnosis not present

## 2020-03-14 NOTE — Patient Instructions (Signed)
PASCUAL MANTEL  03/14/2020     @PREFPERIOPPHARMACY @   Your procedure is scheduled on 03/20/2020.  Report to [facility] at  0900  A.M.  Call this number if you have problems the morning of surgery:  [phone]   Remember:  Follow the diet and prep instructions given to you by the office.                       Take these medicines the morning of surgery with A SIP OF WATER amlodipine.    Do not wear jewelry, make-up or nail polish.  Do not wear lotions, powders, or perfumes. Please wear deodorant and brush your teeth.  Do not shave 48 hours prior to surgery.  Men may shave face and neck.  Do not bring valuables to the hospital.  [facility] is not responsible for any belongings or valuables.  Contacts, dentures or bridgework may not be worn into surgery.  Leave your suitcase in the car.  After surgery it may be brought to your room.  For patients admitted to the hospital, discharge time will be determined by your treatment team.  Patients discharged the day of surgery will not be allowed to drive home.   Name and phone number of your driver:   family Special instructions:  DO NOT smoke the morning of your procedure.  Please read over the following fact sheets that you were given. Anesthesia Post-op Instructions and Care and Recovery After Surgery       Colonoscopy, Adult, Care After This sheet gives you information about how to care for yourself after your procedure. Your health care provider may also give you more specific instructions. If you have problems or questions, contact your health care provider. What can I expect after the procedure? After the procedure, it is common to have:  A small amount of blood in your stool for 24 hours after the procedure.  Some gas.  Mild cramping or bloating of your abdomen. Follow these instructions at home: Eating and drinking   Drink enough fluid to keep your urine pale yellow.  Follow instructions from your  health care provider about eating or drinking restrictions.  Resume your normal diet as instructed by your health care provider. Avoid heavy or fried foods that are hard to digest. Activity  Rest as told by your health care provider.  Avoid sitting for a long time without moving. Get up to take short walks every 1-2 hours. This is important to improve blood flow and breathing. Ask for help if you feel weak or unsteady.  Return to your normal activities as told by your health care provider. Ask your health care provider what activities are safe for you. Managing cramping and bloating   Try walking around when you have cramps or feel bloated.  Apply heat to your abdomen as told by your health care provider. Use the heat source that your health care provider recommends, such as a moist heat pack or a heating pad. ? Place a towel between your skin and the heat source. ? Leave the heat on for 20-30 minutes. ? Remove the heat if your skin turns bright red. This is especially important if you are unable to feel pain, heat, or cold. You may have a greater risk of getting burned. General instructions  For the first 24 hours after the procedure: ? Do not drive or use machinery. ? Do not sign important documents. ?  Do not drink alcohol. ? Do your regular daily activities at a slower pace than normal. ? Eat soft foods that are easy to digest.  Take over-the-counter and prescription medicines only as told by your health care provider.  Keep all follow-up visits as told by your health care provider. This is important. Contact a health care provider if:  You have blood in your stool 2-3 days after the procedure. Get help right away if you have:  More than a small spotting of blood in your stool.  Large blood clots in your stool.  Swelling of your abdomen.  Nausea or vomiting.  A fever.  Increasing pain in your abdomen that is not relieved with medicine. Summary  After the procedure,  it is common to have a small amount of blood in your stool. You may also have mild cramping and bloating of your abdomen.  For the first 24 hours after the procedure, do not drive or use machinery, sign important documents, or drink alcohol.  Get help right away if you have a lot of blood in your stool, nausea or vomiting, a fever, or increased pain in your abdomen. This information is not intended to replace advice given to you by your health care provider. Make sure you discuss any questions you have with your health care provider. Document Revised: 11/29/2018 Document Reviewed: 11/29/2018 Elsevier Patient Education  [place] After These instructions provide you with information about caring for yourself after your procedure. Your health care provider may also give you more specific instructions. Your treatment has been planned according to current medical practices, but problems sometimes occur. Call your health care provider if you have any problems or questions after your procedure. What can I expect after the procedure? After your procedure, you may:  Feel sleepy for several hours.  Feel clumsy and have poor balance for several hours.  Feel forgetful about what happened after the procedure.  Have poor judgment for several hours.  Feel nauseous or vomit.  Have a sore throat if you had a breathing tube during the procedure. Follow these instructions at home: For at least 24 hours after the procedure:      Have a responsible adult stay with you. It is important to have someone help care for you until you are awake and alert.  Rest as needed.  Do not: ? Participate in activities in which you could fall or become injured. ? Drive. ? Use heavy machinery. ? Drink alcohol. ? Take sleeping pills or medicines that cause drowsiness. ? Make important decisions or sign legal documents. ? Take care of children on your own. Eating and  drinking  Follow the diet that is recommended by your health care provider.  If you vomit, drink water, juice, or soup when you can drink without vomiting.  Make sure you have little or no nausea before eating solid foods. General instructions  Take over-the-counter and prescription medicines only as told by your health care provider.  If you have sleep apnea, surgery and certain medicines can increase your risk for breathing problems. Follow instructions from your health care provider about wearing your sleep device: ? Anytime you are sleeping, including during daytime naps. ? While taking prescription pain medicines, sleeping medicines, or medicines that make you drowsy.  If you smoke, do not smoke without supervision.  Keep all follow-up visits as told by your health care provider. This is important. Contact a health care provider if:  You keep  feeling nauseous or you keep vomiting.  You feel light-headed.  You develop a rash.  You have a fever. Get help right away if:  You have trouble breathing. Summary  For several hours after your procedure, you may feel sleepy and have poor judgment.  Have a responsible adult stay with you for at least 24 hours or until you are awake and alert. This information is not intended to replace advice given to you by your health care provider. Make sure you discuss any questions you have with your health care provider. Document Revised: 08/03/2017 Document Reviewed: 08/26/2015 Elsevier Patient Education  [place].

## 2020-03-16 ENCOUNTER — Other Ambulatory Visit (HOSPITAL_COMMUNITY): Payer: No Typology Code available for payment source | Attending: Internal Medicine

## 2020-03-16 ENCOUNTER — Encounter (HOSPITAL_COMMUNITY)
Admission: RE | Admit: 2020-03-16 | Discharge: 2020-03-16 | Disposition: A | Discharge status: Home / Self Care | Payer: No Typology Code available for payment source | Source: Ambulatory Visit | Attending: Internal Medicine | Admitting: Internal Medicine

## 2020-03-16 ENCOUNTER — Encounter (HOSPITAL_COMMUNITY): Payer: Self-pay

## 2020-03-20 ENCOUNTER — Ambulatory Visit (HOSPITAL_COMMUNITY): Admit: 2020-03-20 | Payer: Medicare HMO

## 2020-03-20 ENCOUNTER — Encounter (HOSPITAL_COMMUNITY): Payer: Self-pay

## 2020-03-20 SURGERY — COLONOSCOPY WITH PROPOFOL
Anesthesia: Monitor Anesthesia Care

## 2020-03-21 ENCOUNTER — Telehealth: Payer: Self-pay

## 2020-03-21 NOTE — Telephone Encounter (Signed)
Informed by endo scheduler, TCS was canceled yesterday.  Tried to call pt, no answer, LMOVM for return call.

## 2020-03-22 ENCOUNTER — Telehealth: Payer: Self-pay | Admitting: Internal Medicine

## 2020-03-22 NOTE — Telephone Encounter (Signed)
See other phone already started. 

## 2020-03-22 NOTE — Telephone Encounter (Signed)
Returning call. [phone]

## 2020-03-22 NOTE — Telephone Encounter (Signed)
Spoke to pt, he's unable to have TCS at this time. He's in process of waiting to have radiation for prostate cancer and approval from [place]. He will let our office know when he's able to proceed with TCS.  FYI to Dr. Abbey Chatters.

## 2020-03-29 ENCOUNTER — Telehealth: Payer: Self-pay | Admitting: Radiation Oncology

## 2020-03-29 NOTE — Telephone Encounter (Signed)
Received voicemail message from patient inquiring if this office had received a referral from the [place]. Phoned patient back. Explained the [place] referral was received on 03/27/2020. Went onto explain his telephone consultation with Dr. Tammi Klippel is set for Friday, December 3. Explained this RN would call at 1030 to obtain information then Dr. Tammi Klippel would call at 1100 after reviewing the information I provided him with. Patient verbalized understanding and expressed appreciation for the return call.

## 2020-04-12 DIAGNOSIS — I10 Essential (primary) hypertension: Secondary | ICD-10-CM | POA: Diagnosis not present

## 2020-04-12 DIAGNOSIS — R972 Elevated prostate specific antigen [PSA]: Secondary | ICD-10-CM | POA: Diagnosis not present

## 2020-04-19 ENCOUNTER — Encounter: Payer: Self-pay | Admitting: Radiation Oncology

## 2020-04-19 NOTE — Progress Notes (Signed)
GU Location of Tumor / Histology: prostatic adenocarcinoma  If Prostate Cancer, Gleason Score is (3 + 4) and PSA is (5.3). Prostate volume: 27.4   Biopsies of prostate (if applicable) revealed:   Past/Anticipated interventions by urology, if any: prostate biopsy, referral to Dr. Tammi Klippel to discuss radiation therapy option specifically brachytherapy  Patient has a hx of hernia repair.  Past/Anticipated interventions by medical oncology, if any: no  Weight changes, if any: denies  Bowel/Bladder complaints, if any: IPSS:5 SHIM: 3 Reports nocturia x 2-3. Denies dysuria, hematuria, urinary leakage or incontinence. Denies any bowel complaints.   Nausea/Vomiting, if any: denies  Pain issues, if any:  denies  SAFETY ISSUES:  Prior radiation? denies  Pacemaker/ICD? denies  Possible current pregnancy? no, male patient  Is the patient on methotrexate? no  Current Complaints / other details:  69 year old male. Widowed. Former smoker. Denies a family hx of prostate ca. Reports he continues to work full time. Reports he walks to and from work to remain in shape.

## 2020-04-20 ENCOUNTER — Encounter (INDEPENDENT_AMBULATORY_CARE_PROVIDER_SITE_OTHER): Payer: Self-pay

## 2020-04-20 ENCOUNTER — Telehealth: Payer: Self-pay | Admitting: *Deleted

## 2020-04-20 ENCOUNTER — Other Ambulatory Visit: Payer: Self-pay

## 2020-04-20 ENCOUNTER — Ambulatory Visit
Admission: RE | Admit: 2020-04-20 | Discharge: 2020-04-20 | Disposition: A | Discharge status: Home / Self Care | Payer: No Typology Code available for payment source | Source: Ambulatory Visit | Attending: Radiation Oncology | Admitting: Radiation Oncology

## 2020-04-20 ENCOUNTER — Encounter: Payer: Self-pay | Admitting: Radiation Oncology

## 2020-04-20 VITALS — Ht 68.0 in | Wt 191.0 lb

## 2020-04-20 DIAGNOSIS — C61 Malignant neoplasm of prostate: Secondary | ICD-10-CM

## 2020-04-20 HISTORY — DX: Malignant neoplasm of prostate: C61

## 2020-04-20 NOTE — Telephone Encounter (Signed)
Called patient to ask questions, lvm for a return call 

## 2020-04-20 NOTE — Progress Notes (Signed)
Radiation Oncology         [phone] ________________________________  Initial Outpatient Consultation - Conducted via Telephone due to current COVID-19 concerns for limiting patient exposure  Name: Kent Smith MRN: [id number]  Date: 04/20/2020  DOB: 1951-01-28  XH:[id number], Brandon Melnick, MD  Franchot Gallo, MD   REFERRING PHYSICIAN: Franchot Gallo, MD  DIAGNOSIS: 69 y.o. gentleman with Stage T1c adenocarcinoma of the prostate with Gleason score of 3+4, and PSA of 6.8.    ICD-10-CM   1. Malignant neoplasm of prostate ([place])  C61     HISTORY OF PRESENT ILLNESS: Kent Smith is a 69 y.o. male with a diagnosis of prostate cancer. He was noted to have an elevated PSA of 5.3 in 08/2019 by his primary care physician, Dr. Legrand Rams with the [facility].  Accordingly, he was referred for evaluation in urology by Dr. Diona Fanti on 12/20/2019,  digital rectal examination was performed at that time revealing no nodules.  Repeat PSA from that day showed further elevation to 6.8.  The patient proceeded to transrectal ultrasound with 12 biopsies of the prostate on 01/17/2020.  The prostate volume measured 27.4 cc.  Out of 12 core biopsies, 2 were positive.  The maximum Gleason score was 3+4, and this was seen in left mid and left apex.  Of note, he also recently had a positive FIT test a few months ago. Per conversation with the [facility], he is waiting to undergo colonoscopy until after treatment for his prostate cancer.  The patient reviewed the biopsy results with his urologist and he has kindly been referred today for discussion of potential radiation treatment options. Per note from Dr. Diona Fanti, he is not interested in surgical removal.   PREVIOUS RADIATION THERAPY: No  PAST MEDICAL HISTORY:  Past Medical History:  Diagnosis Date   Hypercholesteremia    Hypertension    Prostate cancer ([place])       PAST SURGICAL HISTORY: Past Surgical History:  Procedure Laterality Date   HERNIA REPAIR      PROSTATE BIOPSY      FAMILY HISTORY:  Family History  Problem Relation Age of Onset   Hypertension Father    CAD Father    Cancer Father        type unknown   Prostate cancer Neg Hx    Colon cancer Neg Hx    Pancreatic cancer Neg Hx    Breast cancer Neg Hx     SOCIAL HISTORY:  Social History   Socioeconomic History   Marital status: Widowed    Spouse name: Not on file   Number of children: Not on file   Years of education: Not on file   Highest education level: Not on file  Occupational History    Comment: continues to work full time  Tobacco Use   Smoking status: Former Smoker    Packs/day: 1.00    Years: 18.00    Pack years: 18.00    Types: Cigarettes    Quit date: 05/19/1986    Years since quitting: 33.9   Smokeless tobacco: Never Used  Vaping Use   Vaping Use: Never used  Substance and Sexual Activity   Alcohol use: Yes    Comment: 2 or 3 beers a week   Drug use: No   Sexual activity: Not Currently  Other Topics Concern   Not on file  Social History Narrative   Not on file   Social Determinants of Health   Financial Resource Strain:    Difficulty of Paying  Living Expenses: Not on file  Food Insecurity:    Worried About [place] in the Last Year: Not on file   Ran Out of Food in the Last Year: Not on file  Transportation Needs:    Lack of Transportation (Medical): Not on file   Lack of Transportation (Non-Medical): Not on file  Physical Activity:    Days of Exercise per Week: Not on file   Minutes of Exercise per Session: Not on file  Stress:    Feeling of Stress : Not on file  Social Connections:    Frequency of Communication with Friends and Family: Not on file   Frequency of Social Gatherings with Friends and Family: Not on file   Attends Religious Services: Not on file   Active Member of Clubs or Organizations: Not on file   Attends Archivist Meetings: Not on file   Marital Status: Not on  file  Intimate Partner Violence:    Fear of Current or Ex-Partner: Not on file   Emotionally Abused: Not on file   Physically Abused: Not on file   Sexually Abused: Not on file    ALLERGIES: Patient has no known allergies.  MEDICATIONS:  Current Outpatient Medications  Medication Sig Dispense Refill   amLODipine (NORVASC) 5 MG tablet Take 5 mg by mouth daily.     atorvastatin (LIPITOR) 10 MG tablet Take 10 mg by mouth daily.     No current facility-administered medications for this encounter.    REVIEW OF SYSTEMS:  On review of systems, the patient reports that he is doing well overall. He denies any chest pain, shortness of breath, cough, fevers, chills, night sweats, unintended weight changes. He denies any bowel disturbances, and denies abdominal pain, nausea or vomiting. He denies any new musculoskeletal or joint aches or pains. His IPSS was 5, indicating mild urinary symptoms. He reports nocturia x2-3. His SHIM was 3, indicating he has severe erectile dysfunction. A complete review of systems is obtained and is otherwise negative.    PHYSICAL EXAM:  Wt Readings from Last 3 Encounters:  04/20/20 191 lb (86.6 kg)  02/01/20 191 lb 9.6 oz (86.9 kg)  12/20/19 192 lb (87.1 kg)   Temp Readings from Last 3 Encounters:  02/01/20 (!) 97.5 F (36.4 C) (Oral)  12/20/19 98.1 F (36.7 C)  12/16/19 98.7 F (37.1 C)   BP Readings from Last 3 Encounters:  02/01/20 139/89  01/17/20 (!) 125/59  12/20/19 (!) 159/95   Pulse Readings from Last 3 Encounters:  02/01/20 87  01/17/20 72  12/20/19 75   Pain Assessment Pain Score: 0-No pain/10  Physical exam not performed in light of telephone encounter.  KPS = 100  100 - Normal; no complaints; no evidence of disease. 90   - Able to carry on normal activity; minor signs or symptoms of disease. 80   - Normal activity with effort; some signs or symptoms of disease. 37   - Cares for self; unable to carry on normal activity or to  do active work. 60   - Requires occasional assistance, but is able to care for most of his personal needs. 50   - Requires considerable assistance and frequent medical care. 44   - Disabled; requires special care and assistance. 69   - Severely disabled; hospital admission is indicated although death not imminent. 45   - Very sick; hospital admission necessary; active supportive treatment necessary. 10   - Moribund; fatal processes progressing rapidly. 0     -  Dead  Karnofsky DA, Abelmann Engelhard, Craver LS and Burchenal [facility] [phone]) The use of the nitrogen mustards in the palliative treatment of carcinoma: with particular reference to bronchogenic carcinoma Cancer 1 634-56  LABORATORY DATA:  Lab Results  Component Value Date   WBC 7.7 12/16/2019   HGB 13.3 12/16/2019   HCT 38.7 (L) 12/16/2019   MCV 87.6 12/16/2019   PLT 270 12/16/2019   Lab Results  Component Value Date   NA 138 12/16/2019   K 3.3 (L) 12/16/2019   CL 101 12/16/2019   CO2 27 12/16/2019   Lab Results  Component Value Date   ALT 25 12/10/2019   AST 22 12/10/2019   ALKPHOS 72 12/10/2019   BILITOT 0.5 12/10/2019     RADIOGRAPHY: No results found.    IMPRESSION/PLAN: This visit was conducted via Telephone to spare the patient unnecessary potential exposure in the healthcare setting during the current COVID-19 pandemic. 1. 69 y.o. gentleman with Stage T1c adenocarcinoma of the prostate with Gleason Score of 3+4, and PSA of 6.8. We discussed the patient's workup and outlined the nature of prostate cancer in this setting. The patient's T stage, Gleason's score, and PSA put him into the favorable intermediate risk group. Accordingly, he is eligible for a variety of potential treatment options including brachytherapy, 5.5 weeks of external radiation, or prostatectomy. We discussed the available radiation techniques, and focused on the details and logistics of delivery. We discussed and outlined the risks, benefits, short and  long-term effects associated with radiotherapy and compared and contrasted these with prostatectomy. We discussed the role of SpaceOAR in reducing the rectal toxicity associated with radiotherapy. He appears to have a good understanding of his disease and our treatment recommendations which are of curative intent.  He was encouraged to ask questions that were answered to his stated satisfaction.  At the end of the conversation, the patient is interested in moving forward with brachytherapy and use of SpaceOAR gel to reduce rectal toxicity from radiotherapy.  We will share our discussion with Dr. Diona Fanti and move forward with scheduling his [facility] planning appointment in the near future.  The patient will be contacted by Romie Jumper in our office who will be working closely with him to coordinate OR scheduling and pre and post procedure appointments.  We will contact the pharmaceutical rep to ensure that [place] is available at the time of procedure.  We enjoyed meeting him today and look forward to continuing to participate in his care.  We would recommend the patient undergo colonoscopy prior to prostate radiotherapy in the event of another primary colorectal cancer in the pelvis which may require radiation.  However, his team at the [facility] has recommended he receive prostate treatment first.  Given current concerns for patient exposure during the COVID-19 pandemic, this encounter was conducted via telephone. The patient was notified in advance and was offered a MyChart meeting to allow for face to face communication but unfortunately reported that he did not have the appropriate resources/technology to support such a visit and instead preferred to proceed with telephone consult. The patient has given verbal consent for this type of encounter. The time spent during this encounter was 60 minutes. The attendants for this meeting include Tyler Pita MD, Ashlyn Bruning PA-C, [place],  and patient, Kent Smith. During the encounter, Tyler Pita MD, Ashlyn Bruning PA-C, and scribe, Wilburn Mylar were located at [place].  Patient, Kent Smith was located  at home.    Nicholos Johns, PA-C    Tyler Pita, MD  [facility] Direct Dial: [phone]   Fax: [phone] [email or web address]   Skype   LinkedIn  This document serves as a record of services personally performed by Tyler Pita, MD and Freeman Caldron, PA-C. It was created on their behalf by Wilburn Mylar, a trained medical scribe. The creation of this record is based on the scribe's personal observations and the provider's statements to them. This document has been checked and approved by the attending provider.

## 2020-04-23 ENCOUNTER — Encounter: Payer: Self-pay | Admitting: Medical Oncology

## 2020-04-23 NOTE — Progress Notes (Signed)
Spoke with patient to introduce myself as the prostate nurse navigator and discuss my role. I was unable to meet him 12/3, when he consulted with Dr. Tammi Klippel. He states he has chosen seed implant to treat his cancer. No barriers to care at this time. I gave him my contact information and asked him to call me with questions or concerns. He voiced understanding.

## 2020-04-24 DIAGNOSIS — I10 Essential (primary) hypertension: Secondary | ICD-10-CM | POA: Diagnosis not present

## 2020-04-24 DIAGNOSIS — Z6830 Body mass index (BMI) 30.0-30.9, adult: Secondary | ICD-10-CM | POA: Diagnosis not present

## 2020-04-24 DIAGNOSIS — E785 Hyperlipidemia, unspecified: Secondary | ICD-10-CM | POA: Diagnosis not present

## 2020-04-24 DIAGNOSIS — Z23 Encounter for immunization: Secondary | ICD-10-CM | POA: Diagnosis not present

## 2020-04-30 ENCOUNTER — Other Ambulatory Visit: Payer: Self-pay | Admitting: Urology

## 2020-05-08 ENCOUNTER — Telehealth: Payer: Self-pay | Admitting: *Deleted

## 2020-05-08 NOTE — Telephone Encounter (Signed)
CALLED PATIENT TO INFORM OF PRE-SEED APPTS. AND IMPLANT DATE, LVM FOR A RETURN CALL 

## 2020-05-09 ENCOUNTER — Telehealth: Payer: Self-pay | Admitting: *Deleted

## 2020-05-09 NOTE — Telephone Encounter (Signed)
CALLED PATIENT TO INFORM OF PRE-SEED APPTS. AND IMPLANT, SPOKE WITH PATIENT AND HE IS AWARE OF THESE APPTS. 

## 2020-05-25 DIAGNOSIS — E785 Hyperlipidemia, unspecified: Secondary | ICD-10-CM | POA: Diagnosis not present

## 2020-05-25 DIAGNOSIS — I10 Essential (primary) hypertension: Secondary | ICD-10-CM | POA: Diagnosis not present

## 2020-05-28 ENCOUNTER — Telehealth: Payer: Self-pay | Admitting: *Deleted

## 2020-05-28 NOTE — Telephone Encounter (Signed)
RETURNED PATIENT'S PHONE CALL, LVM FOR A RETURN CALL 

## 2020-05-30 ENCOUNTER — Telehealth: Payer: Self-pay | Admitting: *Deleted

## 2020-05-30 NOTE — Telephone Encounter (Signed)
CALLED PATIENT TO REMIND OF PRE-SEED APPTS. FOR 03-21-21, SPOKE WITH PATIENT AND HE IS AWARE OF THESE APPTS. 

## 2020-05-31 ENCOUNTER — Encounter (HOSPITAL_COMMUNITY)
Admission: RE | Admit: 2020-05-31 | Discharge: 2020-05-31 | Disposition: A | Discharge status: Home / Self Care | Payer: Medicare HMO | Source: Ambulatory Visit | Attending: Urology | Admitting: Urology

## 2020-05-31 ENCOUNTER — Ambulatory Visit
Admission: RE | Admit: 2020-05-31 | Discharge: 2020-05-31 | Disposition: A | Discharge status: Home / Self Care | Payer: No Typology Code available for payment source | Source: Ambulatory Visit | Attending: Radiation Oncology | Admitting: Radiation Oncology

## 2020-05-31 ENCOUNTER — Other Ambulatory Visit: Payer: Self-pay

## 2020-05-31 ENCOUNTER — Ambulatory Visit (HOSPITAL_COMMUNITY)
Admission: RE | Admit: 2020-05-31 | Discharge: 2020-05-31 | Disposition: A | Discharge status: Home / Self Care | Payer: Medicare HMO | Source: Ambulatory Visit | Attending: Urology | Admitting: Urology

## 2020-05-31 ENCOUNTER — Ambulatory Visit: Payer: No Typology Code available for payment source | Attending: Urology | Admitting: Urology

## 2020-05-31 DIAGNOSIS — Z510 Encounter for antineoplastic radiation therapy: Secondary | ICD-10-CM | POA: Insufficient documentation

## 2020-05-31 DIAGNOSIS — Z01818 Encounter for other preprocedural examination: Secondary | ICD-10-CM

## 2020-05-31 DIAGNOSIS — C61 Malignant neoplasm of prostate: Secondary | ICD-10-CM | POA: Insufficient documentation

## 2020-05-31 MED ORDER — CEFAZOLIN SODIUM-DEXTROSE 2-4 GM/100ML-% IV SOLN
2.0000 g | Freq: Once | INTRAVENOUS | Status: DC
  Filled 2020-05-31: qty 100

## 2020-05-31 NOTE — Progress Notes (Signed)
  Radiation Oncology         [phone] ________________________________  Name: Kent Smith MRN: [id number]  Date: 05/31/2020  DOB: December 09, 1950  SIMULATION AND TREATMENT PLANNING NOTE PUBIC ARCH STUDY  WI:OXBDZ, Brandon Melnick, MD  Franchot Gallo, MD  DIAGNOSIS:   70 y.o. gentleman with Stage T1c adenocarcinoma of the prostate with Gleason score of 3+4, and PSA of 6.8.  Oncology History  Malignant neoplasm of prostate ([place])  01/17/2020 Cancer Staging   Staging form: Prostate, AJCC 8th Edition - Clinical stage from 01/17/2020: Stage IIB (cT1c, cN0, cM0, PSA: 6.8, Grade Group: 2) - Signed by Freeman Caldron, PA-C on 04/20/2020   04/20/2020 Initial Diagnosis   Malignant neoplasm of prostate ([place])       ICD-10-CM   1. Malignant neoplasm of prostate ([place])  C61     COMPLEX SIMULATION:  The patient presented today for evaluation for possible prostate seed implant. He was brought to the radiation planning suite and placed supine on the CT couch. A 3-dimensional image study set was obtained in upload to the planning computer. There, on each axial slice, I contoured the prostate gland. Then, using three-dimensional radiation planning tools I reconstructed the prostate in view of the structures from the transperineal needle pathway to assess for possible pubic arch interference. In doing so, I did not appreciate any pubic arch interference. Also, the patient's prostate volume was estimated based on the drawn structure. The volume was 27 cc.  Given the pubic arch appearance and prostate volume, patient remains a good candidate to proceed with prostate seed implant. Today, he freely provided informed written consent to proceed.    PLAN: The patient will undergo prostate seed implant.   ________________________________  Sheral Apley. Tammi Klippel, M.D.

## 2020-06-22 ENCOUNTER — Telehealth: Payer: Self-pay | Admitting: *Deleted

## 2020-06-22 NOTE — Telephone Encounter (Signed)
Called patient to remind of lab and Covid testing for 06-26-20, spoke with patient and he is aware of these appts.

## 2020-06-25 DIAGNOSIS — I10 Essential (primary) hypertension: Secondary | ICD-10-CM | POA: Diagnosis not present

## 2020-06-25 DIAGNOSIS — C61 Malignant neoplasm of prostate: Secondary | ICD-10-CM | POA: Diagnosis not present

## 2020-06-26 ENCOUNTER — Encounter (HOSPITAL_COMMUNITY)
Admission: RE | Admit: 2020-06-26 | Discharge: 2020-06-26 | Disposition: A | Discharge status: Home / Self Care | Payer: Medicare HMO | Source: Ambulatory Visit | Attending: Urology | Admitting: Urology

## 2020-06-26 ENCOUNTER — Encounter (HOSPITAL_BASED_OUTPATIENT_CLINIC_OR_DEPARTMENT_OTHER): Payer: Self-pay | Admitting: Urology

## 2020-06-26 ENCOUNTER — Other Ambulatory Visit (HOSPITAL_COMMUNITY)
Admission: RE | Admit: 2020-06-26 | Discharge: 2020-06-26 | Disposition: A | Discharge status: Home / Self Care | Payer: Medicare HMO | Source: Ambulatory Visit | Attending: Urology | Admitting: Urology

## 2020-06-26 ENCOUNTER — Other Ambulatory Visit: Payer: Self-pay

## 2020-06-26 DIAGNOSIS — Z20822 Contact with and (suspected) exposure to covid-19: Secondary | ICD-10-CM | POA: Insufficient documentation

## 2020-06-26 DIAGNOSIS — Z01812 Encounter for preprocedural laboratory examination: Secondary | ICD-10-CM | POA: Insufficient documentation

## 2020-06-26 LAB — CBC
HCT: 43.8 % (ref 39.0–52.0)
Hemoglobin: 14.6 g/dL (ref 13.0–17.0)
MCH: 29.4 pg (ref 26.0–34.0)
MCHC: 33.3 g/dL (ref 30.0–36.0)
MCV: 88.3 fL (ref 80.0–100.0)
Platelets: 275 10*3/uL (ref 150–400)
RBC: 4.96 MIL/uL (ref 4.22–5.81)
RDW: 13.9 % (ref 11.5–15.5)
WBC: 6.8 10*3/uL (ref 4.0–10.5)
nRBC: 0 % (ref 0.0–0.2)

## 2020-06-26 LAB — COMPREHENSIVE METABOLIC PANEL
ALT: 21 U/L (ref 0–44)
AST: 24 U/L (ref 15–41)
Albumin: 4.5 g/dL (ref 3.5–5.0)
Alkaline Phosphatase: 73 U/L (ref 38–126)
Anion gap: 12 (ref 5–15)
BUN: 14 mg/dL (ref 8–23)
CO2: 28 mmol/L (ref 22–32)
Calcium: 9.9 mg/dL (ref 8.9–10.3)
Chloride: 98 mmol/L (ref 98–111)
Creatinine, Ser: 0.99 mg/dL (ref 0.61–1.24)
GFR, Estimated: >60 mL/min (ref >60)
Glucose, Bld: 81 mg/dL (ref 70–99)
Potassium: 3.8 mmol/L (ref 3.5–5.1)
Sodium: 138 mmol/L (ref 135–145)
Total Bilirubin: 0.7 mg/dL (ref 0.3–1.2)
Total Protein: 7.9 g/dL (ref 6.5–8.1)

## 2020-06-26 LAB — PROTIME-INR
INR: 1.1 (ref 0.8–1.2)
Prothrombin Time: 13.4 seconds (ref 11.4–15.2)

## 2020-06-26 LAB — APTT: aPTT: 30 seconds (ref 24–36)

## 2020-06-26 LAB — SARS CORONAVIRUS 2 (TAT 6-24 HRS): SARS Coronavirus 2: NEGATIVE

## 2020-06-26 NOTE — Progress Notes (Signed)
Spoke w/ via phone for pre-op interview--- PT Lab needs dos---- no              Lab results------ pt had CBC, CMP, PT/ PTT done 06-26-2020 results in epic/  Current ekg/ cxr in epic/ chart COVID test ------ done 06-26-2020 result in epic Arrive at ------- 1100 on 06-29-2020 NPO after MN NO Solid Food.  Clear liquids from MN until--- 1000 Medications to take morning of surgery ----- NONE Diabetic medication ----- n/a Patient Special Instructions ----- will do one fleet enema morning of surgery Pre-Op special Istructions ----- n/a Patient verbalized understanding of instructions that were given at this phone interview. Patient denies shortness of breath, chest pain, fever, cough at this phone interview.

## 2020-06-28 ENCOUNTER — Telehealth: Payer: Self-pay | Admitting: *Deleted

## 2020-06-28 NOTE — Anesthesia Preprocedure Evaluation (Addendum)
Anesthesia Evaluation  Patient identified by MRN, date of birth, ID band Patient awake    Reviewed: Allergy & Precautions, NPO status , Patient's Chart, lab work & pertinent test results  History of Anesthesia Complications Negative for: history of anesthetic complications  Airway Mallampati: II  TM Distance: >3 FB Neck ROM: Full    Dental  (+) Poor Dentition,    Pulmonary former smoker,    Pulmonary exam normal        Cardiovascular hypertension, Pt. on medications Normal cardiovascular exam     Neuro/Psych negative neurological ROS  negative psych ROS   GI/Hepatic negative GI ROS, Neg liver ROS,   Endo/Other  negative endocrine ROS  Renal/GU negative Renal ROS   Prostate ca    Musculoskeletal negative musculoskeletal ROS (+)   Abdominal   Peds  Hematology negative hematology ROS (+)   Anesthesia Other Findings Day of surgery medications reviewed with patient.  Reproductive/Obstetrics negative OB ROS                            Anesthesia Physical Anesthesia Plan  ASA: II  Anesthesia Plan: General   Post-op Pain Management:    Induction: Intravenous  PONV Risk Score and Plan: 3 and Treatment may vary due to age or medical condition, Ondansetron and Dexamethasone  Airway Management Planned: LMA  Additional Equipment: None  Intra-op Plan:   Post-operative Plan: Extubation in OR  Informed Consent: I have reviewed the patients History and Physical, chart, labs and discussed the procedure including the risks, benefits and alternatives for the proposed anesthesia with the patient or authorized representative who has indicated his/her understanding and acceptance.     Dental advisory given  Plan Discussed with: CRNA  Anesthesia Plan Comments:        Anesthesia Quick Evaluation

## 2020-06-28 NOTE — Telephone Encounter (Signed)
CALLED PATIENT TO REMIND OF PROCEDURE FOR 06-29-20, SPOKE WITH PATIENT AND HE IS AWARE OF THIS PROCEDURE

## 2020-06-29 ENCOUNTER — Encounter (HOSPITAL_BASED_OUTPATIENT_CLINIC_OR_DEPARTMENT_OTHER)
Admission: RE | Disposition: A | Discharge status: Home / Self Care | Payer: Self-pay | Source: Home / Self Care | Attending: Urology

## 2020-06-29 ENCOUNTER — Ambulatory Visit (HOSPITAL_COMMUNITY): Payer: Medicare HMO

## 2020-06-29 ENCOUNTER — Ambulatory Visit (HOSPITAL_BASED_OUTPATIENT_CLINIC_OR_DEPARTMENT_OTHER): Payer: Medicare HMO | Admitting: Anesthesiology

## 2020-06-29 ENCOUNTER — Ambulatory Visit (HOSPITAL_BASED_OUTPATIENT_CLINIC_OR_DEPARTMENT_OTHER)
Admission: RE | Admit: 2020-06-29 | Discharge: 2020-06-29 | Disposition: A | Discharge status: Home / Self Care | Payer: Medicare HMO | Attending: Urology | Admitting: Urology

## 2020-06-29 ENCOUNTER — Other Ambulatory Visit: Payer: Self-pay

## 2020-06-29 ENCOUNTER — Encounter (HOSPITAL_BASED_OUTPATIENT_CLINIC_OR_DEPARTMENT_OTHER): Payer: Self-pay | Admitting: Urology

## 2020-06-29 DIAGNOSIS — Z8249 Family history of ischemic heart disease and other diseases of the circulatory system: Secondary | ICD-10-CM | POA: Insufficient documentation

## 2020-06-29 DIAGNOSIS — I10 Essential (primary) hypertension: Secondary | ICD-10-CM | POA: Insufficient documentation

## 2020-06-29 DIAGNOSIS — Z809 Family history of malignant neoplasm, unspecified: Secondary | ICD-10-CM | POA: Diagnosis not present

## 2020-06-29 DIAGNOSIS — E785 Hyperlipidemia, unspecified: Secondary | ICD-10-CM | POA: Diagnosis not present

## 2020-06-29 DIAGNOSIS — Z8719 Personal history of other diseases of the digestive system: Secondary | ICD-10-CM | POA: Diagnosis not present

## 2020-06-29 DIAGNOSIS — C61 Malignant neoplasm of prostate: Secondary | ICD-10-CM | POA: Diagnosis not present

## 2020-06-29 DIAGNOSIS — Z87891 Personal history of nicotine dependence: Secondary | ICD-10-CM | POA: Insufficient documentation

## 2020-06-29 HISTORY — PX: CYSTOSCOPY: SHX5120

## 2020-06-29 HISTORY — PX: RADIOACTIVE SEED IMPLANT: SHX5150

## 2020-06-29 HISTORY — PX: SPACE OAR INSTILLATION: SHX6769

## 2020-06-29 HISTORY — DX: Presence of spectacles and contact lenses: Z97.3

## 2020-06-29 HISTORY — DX: Hyperlipidemia, unspecified: E78.5

## 2020-06-29 SURGERY — INSERTION, RADIATION SOURCE, PROSTATE
Anesthesia: General | Site: Rectum

## 2020-06-29 MED ORDER — PHENYLEPHRINE 40 MCG/ML (10ML) SYRINGE FOR IV PUSH (FOR BLOOD PRESSURE SUPPORT)
PREFILLED_SYRINGE | INTRAVENOUS | Status: AC
  Filled 2020-06-29: qty 20

## 2020-06-29 MED ORDER — PROPOFOL 10 MG/ML IV BOLUS
INTRAVENOUS | Status: AC
  Filled 2020-06-29: qty 20

## 2020-06-29 MED ORDER — FENTANYL CITRATE (PF) 100 MCG/2ML IJ SOLN
INTRAMUSCULAR | Status: DC | PRN
  Administered 2020-06-29 (×2): 50 ug via INTRAVENOUS

## 2020-06-29 MED ORDER — DEXAMETHASONE SODIUM PHOSPHATE 4 MG/ML IJ SOLN
INTRAMUSCULAR | Status: DC | PRN
  Administered 2020-06-29: 8 mg via INTRAVENOUS

## 2020-06-29 MED ORDER — LIDOCAINE HCL (CARDIAC) PF 100 MG/5ML IV SOSY
PREFILLED_SYRINGE | INTRAVENOUS | Status: DC | PRN
  Administered 2020-06-29: 80 mg via INTRAVENOUS
  Administered 2020-06-29: 10 mg via INTRAVENOUS

## 2020-06-29 MED ORDER — ONDANSETRON HCL 4 MG/2ML IJ SOLN
INTRAMUSCULAR | Status: DC | PRN
  Administered 2020-06-29: 4 mg via INTRAVENOUS

## 2020-06-29 MED ORDER — CEFAZOLIN SODIUM-DEXTROSE 2-4 GM/100ML-% IV SOLN: 2.0000 g | INTRAVENOUS | Status: DC

## 2020-06-29 MED ORDER — IOHEXOL 300 MG/ML  SOLN
INTRAMUSCULAR | Status: DC | PRN
  Administered 2020-06-29: 7 mL

## 2020-06-29 MED ORDER — PROMETHAZINE HCL 25 MG/ML IJ SOLN
6.2500 mg | INTRAMUSCULAR | Status: DC | PRN
Start: 2020-06-29 — End: 2020-06-29

## 2020-06-29 MED ORDER — CEFAZOLIN (ANCEF) 1 G IV SOLR: 2.0000 g | INTRAVENOUS | Status: DC

## 2020-06-29 MED ORDER — OXYCODONE HCL 5 MG/5ML PO SOLN: 5.0000 mg | Freq: Once | ORAL | Status: DC | PRN

## 2020-06-29 MED ORDER — LACTATED RINGERS IV SOLN: INTRAVENOUS | Status: DC

## 2020-06-29 MED ORDER — PROPOFOL 10 MG/ML IV BOLUS
INTRAVENOUS | Status: DC | PRN
  Administered 2020-06-29: 160 mg via INTRAVENOUS

## 2020-06-29 MED ORDER — FLEET ENEMA 7-19 GM/118ML RE ENEM: 1.0000 | ENEMA | Freq: Once | RECTAL | Status: DC

## 2020-06-29 MED ORDER — ONDANSETRON HCL 4 MG/2ML IJ SOLN
INTRAMUSCULAR | Status: AC
  Filled 2020-06-29: qty 2

## 2020-06-29 MED ORDER — PHENYLEPHRINE 40 MCG/ML (10ML) SYRINGE FOR IV PUSH (FOR BLOOD PRESSURE SUPPORT)
PREFILLED_SYRINGE | INTRAVENOUS | Status: AC
  Filled 2020-06-29: qty 10

## 2020-06-29 MED ORDER — EPHEDRINE 5 MG/ML INJ
INTRAVENOUS | Status: AC
  Filled 2020-06-29: qty 20

## 2020-06-29 MED ORDER — ACETAMINOPHEN 500 MG PO TABS
1000.0000 mg | ORAL_TABLET | Freq: Once | ORAL | Status: AC
  Administered 2020-06-29: 1000 mg via ORAL

## 2020-06-29 MED ORDER — CEFAZOLIN SODIUM-DEXTROSE 2-4 GM/100ML-% IV SOLN
INTRAVENOUS | Status: AC
  Filled 2020-06-29: qty 100

## 2020-06-29 MED ORDER — LIDOCAINE HCL (PF) 2 % IJ SOLN
INTRAMUSCULAR | Status: AC
  Filled 2020-06-29: qty 5

## 2020-06-29 MED ORDER — DEXAMETHASONE SODIUM PHOSPHATE 10 MG/ML IJ SOLN
INTRAMUSCULAR | Status: AC
  Filled 2020-06-29: qty 1

## 2020-06-29 MED ORDER — OXYCODONE HCL 5 MG PO TABS: 5.0000 mg | ORAL_TABLET | Freq: Once | ORAL | Status: DC | PRN

## 2020-06-29 MED ORDER — GLYCOPYRROLATE 0.2 MG/ML IJ SOLN
INTRAMUSCULAR | Status: DC | PRN
  Administered 2020-06-29: .1 mg via INTRAVENOUS

## 2020-06-29 MED ORDER — PHENYLEPHRINE HCL (PRESSORS) 10 MG/ML IV SOLN
INTRAVENOUS | Status: DC | PRN
  Administered 2020-06-29: 80 ug via INTRAVENOUS
  Administered 2020-06-29: 40 ug via INTRAVENOUS
  Administered 2020-06-29 (×2): 80 ug via INTRAVENOUS
  Administered 2020-06-29: 40 ug via INTRAVENOUS
  Administered 2020-06-29 (×2): 80 ug via INTRAVENOUS
  Administered 2020-06-29: 120 ug via INTRAVENOUS
  Administered 2020-06-29: 80 ug via INTRAVENOUS

## 2020-06-29 MED ORDER — FENTANYL CITRATE (PF) 250 MCG/5ML IJ SOLN
INTRAMUSCULAR | Status: AC
  Filled 2020-06-29: qty 5

## 2020-06-29 MED ORDER — EPHEDRINE SULFATE 50 MG/ML IJ SOLN
INTRAMUSCULAR | Status: DC | PRN
Start: 2020-06-29 — End: 2020-06-29
  Administered 2020-06-29 (×2): 5 mg via INTRAVENOUS

## 2020-06-29 MED ORDER — FENTANYL CITRATE (PF) 100 MCG/2ML IJ SOLN: 25.0000 ug | INTRAMUSCULAR | Status: DC | PRN

## 2020-06-29 MED ORDER — ACETAMINOPHEN 500 MG PO TABS
ORAL_TABLET | ORAL | Status: AC
  Filled 2020-06-29: qty 2

## 2020-06-29 MED ORDER — CEFAZOLIN SODIUM-DEXTROSE 2-3 GM-%(50ML) IV SOLR
INTRAVENOUS | Status: DC | PRN
  Administered 2020-06-29: 2 g via INTRAVENOUS

## 2020-06-29 MED ORDER — SODIUM CHLORIDE 0.9 % IR SOLN
Status: DC | PRN
  Administered 2020-06-29: 1000 mL

## 2020-06-29 MED ORDER — SODIUM CHLORIDE (PF) 0.9 % IJ SOLN
INTRAMUSCULAR | Status: DC | PRN
  Administered 2020-06-29: 10 mL

## 2020-06-29 SURGICAL SUPPLY — 40 items
BAG DRN RND TRDRP ANRFLXCHMBR (UROLOGICAL SUPPLIES) ×3
BAG URINE DRAIN 2000ML AR STRL (UROLOGICAL SUPPLIES) ×4 IMPLANT
BLADE CLIPPER SENSICLIP SURGIC (BLADE) ×4 IMPLANT
CATH FOLEY 2WAY SLVR  5CC 16FR (CATHETERS) ×2
CATH FOLEY 2WAY SLVR 5CC 16FR (CATHETERS) ×3 IMPLANT
CATH ROBINSON RED A/P 16FR (CATHETERS) IMPLANT
CATH ROBINSON RED A/P 20FR (CATHETERS) ×4 IMPLANT
CLOTH BEACON ORANGE TIMEOUT ST (SAFETY) ×4 IMPLANT
CNTNR URN SCR LID CUP LEK RST (MISCELLANEOUS) ×6 IMPLANT
CONT SPEC 4OZ STRL OR WHT (MISCELLANEOUS) ×8
COVER BACK TABLE 60X90IN (DRAPES) ×4 IMPLANT
COVER MAYO STAND STRL (DRAPES) ×4 IMPLANT
DRAPE C-ARM 35X43 STRL (DRAPES) ×4 IMPLANT
DRSG TEGADERM 4X4.75 (GAUZE/BANDAGES/DRESSINGS) ×4 IMPLANT
DRSG TEGADERM 8X12 (GAUZE/BANDAGES/DRESSINGS) ×8 IMPLANT
GAUZE SPONGE 4X4 12PLY STRL (GAUZE/BANDAGES/DRESSINGS) ×4 IMPLANT
GLOVE SURG ENC MOIS LTX SZ6.5 (GLOVE) ×4 IMPLANT
GLOVE SURG ENC MOIS LTX SZ7.5 (GLOVE) IMPLANT
GLOVE SURG ENC MOIS LTX SZ8 (GLOVE) ×8 IMPLANT
GLOVE SURG GAMMEX PI TX LF 6.5 (GLOVE) ×4 IMPLANT
GLOVE SURG ORTHO LTX SZ8.5 (GLOVE) ×4 IMPLANT
GLOVE SURG POLYISO LF SZ6.5 (GLOVE) IMPLANT
GLOVE SURG UNDER POLY LF SZ7 (GLOVE) ×4 IMPLANT
GLOVE SURG UNDER POLY LF SZ7.5 (GLOVE) ×4 IMPLANT
GOWN STRL REUS W/TWL LRG LVL3 (GOWN DISPOSABLE) ×12 IMPLANT
GOWN STRL REUS W/TWL XL LVL3 (GOWN DISPOSABLE) ×4 IMPLANT
HOLDER FOLEY CATH W/STRAP (MISCELLANEOUS) ×4 IMPLANT
IMPL SPACEOAR VUE SYSTEM (Spacer) ×3 IMPLANT
IMPLANT SPACEOAR VUE SYSTEM (Spacer) ×4 IMPLANT
IV NS 1000ML (IV SOLUTION) ×4
IV NS 1000ML BAXH (IV SOLUTION) ×3 IMPLANT
KIT TURNOVER CYSTO (KITS) ×4 IMPLANT
MARKER SKIN DUAL TIP RULER LAB (MISCELLANEOUS) ×4 IMPLANT
PACK CYSTO (CUSTOM PROCEDURE TRAY) ×4 IMPLANT
SUT BONE WAX W31G (SUTURE) IMPLANT
SYR 10ML LL (SYRINGE) ×4 IMPLANT
TOWEL OR 17X26 10 PK STRL BLUE (TOWEL DISPOSABLE) ×4 IMPLANT
UNDERPAD 30X36 HEAVY ABSORB (UNDERPADS AND DIAPERS) ×8 IMPLANT
WATER STERILE IRR 500ML POUR (IV SOLUTION) ×4 IMPLANT
i-seed agx100 ×272 IMPLANT

## 2020-06-29 NOTE — Anesthesia Postprocedure Evaluation (Signed)
Anesthesia Post Note  Patient: JACOBY ZANNI  Procedure(s) Performed: RADIOACTIVE SEED IMPLANT/BRACHYTHERAPY IMPLANT (N/A Prostate) SPACE OAR INSTILLATION (N/A Rectum) CYSTOSCOPY FLEXIBLE (Bladder)     Patient location during evaluation: PACU Anesthesia Type: General Level of consciousness: awake and alert and oriented Pain management: pain level controlled Vital Signs Assessment: post-procedure vital signs reviewed and stable Respiratory status: spontaneous breathing, nonlabored ventilation and respiratory function stable Cardiovascular status: blood pressure returned to baseline Postop Assessment: no apparent nausea or vomiting Anesthetic complications: no   No complications documented.  Last Vitals:  Vitals:   06/29/20 1445 06/29/20 1456  BP: 137/73 135/81  Pulse: 82 83  Resp: 12 15  Temp:    SpO2: 100% 100%    Last Pain:  Vitals:   06/29/20 1456  TempSrc:   PainSc: 0-No pain                 Brennan Bailey

## 2020-06-29 NOTE — Transfer of Care (Signed)
Immediate Anesthesia Transfer of Care Note  Patient: Kent Smith  Procedure(s) Performed: RADIOACTIVE SEED IMPLANT/BRACHYTHERAPY IMPLANT (N/A Prostate) SPACE OAR INSTILLATION (N/A Rectum) CYSTOSCOPY FLEXIBLE (Bladder)  Patient Location: PACU  Anesthesia Type:General  Level of Consciousness: drowsy and patient cooperative  Airway & Oxygen Therapy: Patient Spontanous Breathing and Patient connected to face mask oxygen  Post-op Assessment: Report given to RN and Post -op Vital signs reviewed and stable  Post vital signs: Reviewed and stable  Last Vitals:  Vitals Value Taken Time  BP 134/77 06/29/20 1430  Temp    Pulse 85 06/29/20 1433  Resp 21 06/29/20 1433  SpO2 100 % 06/29/20 1433  Vitals shown include unvalidated device data.  Last Pain:  Vitals:   06/29/20 1139  TempSrc: Oral         Complications: No complications documented.

## 2020-06-29 NOTE — Op Note (Signed)
Preoperative diagnosis: Clinical stage TI C adenocarcinoma the prostate   Postoperative diagnosis: Same   Procedure: I-125 prostate seed implantation, SpaceOAR placement, flexible cystoscopy  Surgeon: Caprice Wasko M. Shanda Cadotte M.D.  Radiation Oncologist: Matthew Manning, M.D.  Anesthesia: Gen.   Indications: Patient  was diagnosed with clinical stage TI C prostate cancer. We had extensive discussion with him about treatment options versus. He elected to proceed with seed implantation. He underwent consultation my office as well as with Dr. Manning. He appeared to understand the advantages disadvantages potential risks of this treatment option. Full informed consent has been obtained.   Technique and findings: Patient was brought the operating room where he had successful induction of general anesthesia. He was placed in dorso-lithotomy position and prepped and draped in usual manner. Appropriate surgical timeout was performed. Radiation oncology department placed a transrectal ultrasound probe anchoring stand. Foley catheter with contrast in the balloon was inserted without difficulty. Anchoring needles were placed within the prostate. Rectal tube was placed. Real-time contouring of the urethra prostate and rectum were performed and the dosing parameters were established. Targeted dose was 145 gray.  I was then called  to the operating suite suite for placement of the needles. A second timeout was performed. All needle passage was done with real-time transrectal ultrasound guidance with the sagittal plane. A total of 21 needles were placed.  68 active seeds were implanted.  I then proceeded with placement of SpaceOAR by introducing a needle with the bevel angled inferiorly approximately 2 cm superior to the anus. This was angled downward and under direct ultrasound was placed within the space between the prostatic capsule and rectum. This was confirmed with a small amount of sterile saline injected and  this was performed under direct ultrasound. I then attached the SpaceOAR to the needle and injected this in the space between the prostate and rectum with good placement noted. The Foley catheter was removed and flexible cystoscopy failed to show any seeds outside the prostate.  The patient was brought to recovery room in stable condition, having tolerated the procedure well..     

## 2020-06-29 NOTE — H&P (Signed)
H&P  Chief Complaint: Prostate cancer  History of Present Illness: 70 year old male with relatively low risk prostate cancer presents at this time for I-125 brachytherapy for definitive curative management  Past Medical History:  Diagnosis Date  . History of colitis 11/2019   ED visit in epic  . Hyperlipidemia   . Hypertension    followed by pcp  . Prostate cancer [facility]) urologist--- dr Maysel Mccolm/  oncologist--- dr Tammi Klippel   dx 08/ 2021,  Stage T1c, Gleason 3+4  . Wears glasses     Past Surgical History:  Procedure Laterality Date  . INGUINAL HERNIA REPAIR Left 10-21-2002  @[facility]   . PROSTATE BIOPSY  08/ 2021 in urologist office    Home Medications:    Allergies: No Known Allergies  Family History  Problem Relation Age of Onset  . Hypertension Father   . CAD Father   . Cancer Father        type unknown  . Prostate cancer Neg Hx   . Colon cancer Neg Hx   . Pancreatic cancer Neg Hx   . Breast cancer Neg Hx     Social History:  reports that he quit smoking about 34 years ago. His smoking use included cigarettes. He has a 18.00 pack-year smoking history. He has never used smokeless tobacco. He reports current alcohol use of about 2.0 - 3.0 standard drinks of alcohol per week. He reports previous drug use. Drug: Marijuana.  ROS: A complete review of systems was performed.  All systems are negative except for pertinent findings as noted.  Physical Exam:  Vital signs in last 24 hours: BP (!) 155/89   Pulse 85   Temp 98.1 F (36.7 C) (Oral)   Resp 14   Ht 5\' 8"  (4.268 m)   Wt 86.2 kg   BMI 28.90 kg/m  Constitutional:  Alert and oriented, No acute distress Cardiovascular: Regular rate  Respiratory: Normal respiratory effort GI: Abdomen is soft, nontender, nondistended, no abdominal masses. No CVAT.  Genitourinary: Normal male phallus, testes are descended bilaterally and non-tender and without masses, scrotum is normal in appearance without lesions or masses, perineum  is normal on inspection. Lymphatic: No lymphadenopathy Neurologic: Grossly intact, no focal deficits Psychiatric: Normal mood and affect  Laboratory Data:  No results for input(s): WBC, HGB, HCT, PLT in the last 72 hours.  No results for input(s): NA, K, CL, GLUCOSE, BUN, CALCIUM, CREATININE in the last 72 hours.  Invalid input(s): CO3   No results found for this or any previous visit (from the past 24 hour(s)). Recent Results (from the past 240 hour(s))  SARS CORONAVIRUS 2 (TAT 6-24 HRS) Nasopharyngeal Nasopharyngeal Swab     Status: None   Collection Time: 06/26/20 11:39 AM   Specimen: Nasopharyngeal Swab  Result Value Ref Range Status   SARS Coronavirus 2 NEGATIVE NEGATIVE Final    Comment: (NOTE) SARS-CoV-2 target nucleic acids are NOT DETECTED.  The SARS-CoV-2 RNA is generally detectable in upper and lower respiratory specimens during the acute phase of infection. Negative results do not preclude SARS-CoV-2 infection, do not rule out co-infections with other pathogens, and should not be used as the sole basis for treatment or other patient management decisions. Negative results must be combined with clinical observations, patient history, and epidemiological information. The expected result is Negative.  Fact Sheet for Patients: SugarRoll.be  Fact Sheet for Healthcare Providers: https://www.woods-mathews.com/  This test is not yet approved or cleared by the [place] FDA and  has been authorized for detection  and/or diagnosis of SARS-CoV-2 by FDA under an Emergency Use Authorization (EUA). This EUA will remain  in effect (meaning this test can be used) for the duration of the COVID-19 declaration under Se ction 564(b)(1) of the Act, 21 U.S.C. section 360bbb-3(b)(1), unless the authorization is terminated or revoked sooner.  Performed at [facility], [place] [address], [place], [place] [address]     Renal  Function: Recent Labs    06/26/20 1103  CREATININE 0.99   Estimated Creatinine Clearance: 74.1 mL/min (by C-G formula based on SCr of 0.99 mg/dL).  Radiologic Imaging: No results found.  Impression/Assessment:  Prostate cancer  Plan:  I-125 brachytherapy

## 2020-06-29 NOTE — Anesthesia Procedure Notes (Signed)
Procedure Name: LMA Insertion Date/Time: 06/29/2020 1:04 PM Performed by: Georgeanne Nim, CRNA Pre-anesthesia Checklist: Patient identified, Emergency Drugs available, Suction available, Patient being monitored and Timeout performed Patient Re-evaluated:Patient Re-evaluated prior to induction Oxygen Delivery Method: Circle system utilized Preoxygenation: Pre-oxygenation with 100% oxygen Induction Type: IV induction LMA: LMA inserted LMA Size: 4.0 Number of attempts: 1 Placement Confirmation: positive ETCO2,  CO2 detector and breath sounds checked- equal and bilateral Tube secured with: Tape Dental Injury: Teeth and Oropharynx as per pre-operative assessment

## 2020-06-29 NOTE — Discharge Instructions (Signed)
Radioactive Seed Implant Home Care Instructions ° ° °Activity:    Rest for the remainder of the day.  Do not drive or operate equipment today.  You may resume normal  activities in a few days as instructed by your physician, without risk of harmful radiation exposure to those around you, provided you follow the time and distance precautions on the Radiation Oncology Instruction Sheet. ° ° °Meals: Drink plenty of lipuids and eat light foods, such as gelatin or soup this evening .  You may return to normal meal plan tomorrow. ° °Return °To Work: You may return to work as instructed by your physician. ° °Special °Instruction:   If any seeds are found, use tweezers to pick up seeds and place in a glass container of any kind and bring to your physician's office. ° °Call your physician if any of these symptoms occur: ° °· Persistent or heavy bleeding °· Urine stream diminishes or stops completely after catheter is removed °· Fever equal to or greater than 101 degrees F °· Cloudy urine with a strong foul odor °· Severe pain ° °You may feel some burning pain and/or hesitancy when you urinate after the catheter is removed.  These symptoms may increase over the next few weeks, but should diminish within forur to six weeks.  Applying moist heat to the lower abdomen or a hot tub bath may help relieve the pain.  If the discomfort becomes severe, please call your physician for additional medications. ° ° °Post Anesthesia Home Care Instructions ° °Activity: °Get plenty of rest for the remainder of the day. A responsible individual must stay with you for 24 hours following the procedure.  °For the next 24 hours, DO NOT: °-Drive a car °-Operate machinery °-Drink alcoholic beverages °-Take any medication unless instructed by your physician °-Make any legal decisions or sign important papers. ° °Meals: °Start with liquid foods such as gelatin or soup. Progress to regular foods as tolerated. Avoid greasy, spicy, heavy foods. If nausea  and/or vomiting occur, drink only clear liquids until the nausea and/or vomiting subsides. Call your physician if vomiting continues. ° °Special Instructions/Symptoms: °Your throat may feel dry or sore from the anesthesia or the breathing tube placed in your throat during surgery. If this causes discomfort, gargle with warm salt water. The discomfort should disappear within 24 hours. ° °   ° ° ° ° ° °

## 2020-06-29 NOTE — Progress Notes (Signed)
  Radiation Oncology         [phone] ________________________________  Name: Kent Smith MRN: [id number]  Date: 06/29/2020  DOB: 15-Jun-1950       Prostate Seed Implant  AS:NKNLZ, Brandon Melnick, MD  No ref. provider found  DIAGNOSIS: 70 y.o. gentleman with Stage T1c adenocarcinoma of the prostate with Gleason score of 3+4, and PSA of 6.8.  Oncology History  Malignant neoplasm of prostate ([place])  01/17/2020 Cancer Staging   Staging form: Prostate, AJCC 8th Edition - Clinical stage from 01/17/2020: Stage IIB (cT1c, cN0, cM0, PSA: 6.8, Grade Group: 2) - Signed by Freeman Caldron, PA-C on 04/20/2020   04/20/2020 Initial Diagnosis   Malignant neoplasm of prostate ([place])     PROCEDURE: Insertion of radioactive I-125 seeds into the prostate gland.  RADIATION DOSE: 145 Gy, definitive therapy.  TECHNIQUE: Kent Smith was brought to the operating room with the urologist. He was placed in the dorsolithotomy position. He was catheterized and a rectal tube was inserted. The perineum was shaved, prepped and draped. The ultrasound probe was then introduced into the rectum to see the prostate gland.  TREATMENT DEVICE: A needle grid was attached to the ultrasound probe stand and anchor needles were placed.  3D PLANNING: The prostate was imaged in 3D using a sagittal sweep of the prostate probe. These images were transferred to the planning computer. There, the prostate, urethra and rectum were defined on each axial reconstructed image. Then, the software created an optimized 3D plan and a few seed positions were adjusted. The quality of the plan was reviewed using [facility] information for the target and the following two organs at risk:  Urethra and Rectum.  Then the accepted plan was printed and handed off to the radiation therapist.  Under my supervision, the custom loading of the seeds and spacers was carried out and loaded into sealed vicryl sleeves.  These pre-loaded needles were then placed into the  needle holder.Marland Kitchen  PROSTATE VOLUME STUDY:  Using transrectal ultrasound the volume of the prostate was verified to be 29 cc.  SPECIAL TREATMENT PROCEDURE/SUPERVISION AND HANDLING: The pre-loaded needles were then delivered under sagittal guidance. A total of 21 needles were used to deposit 68 seeds in the prostate gland. The individual seed activity was 0.378 mCi.  SpaceOAR:  Yes  COMPLEX SIMULATION: At the end of the procedure, an anterior radiograph of the pelvis was obtained to document seed positioning and count. Cystoscopy was performed to check the urethra and bladder.  MICRODOSIMETRY: At the end of the procedure, the patient was emitting 0.071 mR/hr at 1 meter. Accordingly, he was considered safe for hospital discharge.  PLAN: The patient will return to the radiation oncology clinic for post implant CT dosimetry in three weeks.   ________________________________  Sheral Apley Tammi Klippel, M.D.

## 2020-07-02 ENCOUNTER — Encounter (HOSPITAL_BASED_OUTPATIENT_CLINIC_OR_DEPARTMENT_OTHER): Payer: Self-pay | Admitting: Urology

## 2020-07-23 DIAGNOSIS — C61 Malignant neoplasm of prostate: Secondary | ICD-10-CM | POA: Diagnosis not present

## 2020-07-23 DIAGNOSIS — I10 Essential (primary) hypertension: Secondary | ICD-10-CM | POA: Diagnosis not present

## 2020-07-25 ENCOUNTER — Telehealth: Payer: Self-pay | Admitting: *Deleted

## 2020-07-25 NOTE — Progress Notes (Signed)
Patient is here today for a follow-up post seed appointment. Patient reports: Dysuria None Hematuria None Nocturia x3 Leakage some Urgency no Emptying bladder no Stream moderate Push or strain to start stream sometime Bowels none Next appointment with urologist  May 10,2022 IPPS 16 Vitals:   07/26/20 1332  BP: (!) 141/96  Pulse: 79  Resp: 18  SpO2: 100%  Weight: 86.4 kg

## 2020-07-25 NOTE — Telephone Encounter (Signed)
Called patient to remind of post seed appts. for 07-26-20, spoke with patient and he is aware of these appts.

## 2020-07-26 ENCOUNTER — Encounter: Payer: Self-pay | Admitting: Urology

## 2020-07-26 ENCOUNTER — Ambulatory Visit
Admission: RE | Admit: 2020-07-26 | Discharge: 2020-07-26 | Disposition: A | Discharge status: Home / Self Care | Payer: No Typology Code available for payment source | Source: Ambulatory Visit | Attending: Urology | Admitting: Urology

## 2020-07-26 ENCOUNTER — Ambulatory Visit
Admission: RE | Admit: 2020-07-26 | Discharge: 2020-07-26 | Disposition: A | Discharge status: Home / Self Care | Payer: No Typology Code available for payment source | Source: Ambulatory Visit | Attending: Radiation Oncology | Admitting: Radiation Oncology

## 2020-07-26 ENCOUNTER — Other Ambulatory Visit: Payer: Self-pay

## 2020-07-26 VITALS — BP 141/96 | HR 79 | Resp 18 | Wt 190.4 lb

## 2020-07-26 DIAGNOSIS — C61 Malignant neoplasm of prostate: Secondary | ICD-10-CM

## 2020-07-26 DIAGNOSIS — Z79899 Other long term (current) drug therapy: Secondary | ICD-10-CM | POA: Insufficient documentation

## 2020-07-26 DIAGNOSIS — Z923 Personal history of irradiation: Secondary | ICD-10-CM | POA: Insufficient documentation

## 2020-07-26 NOTE — Progress Notes (Signed)
Radiation Oncology         [phone] ________________________________  Name: Kent Smith MRN: [id number]  Date: 07/26/2020  DOB: 09/19/1950  Post-Seed Follow-Up Visit Note  CC: Rosita Fire, MD  Franchot Gallo, MD  Diagnosis:   70 y.o. gentleman with Stage T1c adenocarcinoma of the prostate with Gleason score of 3+4, and PSA of 6.8.    ICD-10-CM   1. Malignant neoplasm of prostate ([facility])  C61     Interval Since Last Radiation:  4 weeks 06/29/20:  Insertion of radioactive I-125 seeds into the prostate gland; 145 Gy, definitive therapy with placement of SpaceOAR Vue gel.  Narrative:  The patient returns today for routine follow-up.  He is complaining of increased urinary frequency and urinary hesitation symptoms. He filled out a questionnaire regarding urinary function today providing and overall IPSS score of 16 characterizing his symptoms as moderate with nocturia x3, weak flow of stream, hesitancy, intermittency and occasional feelings of incomplete emptying.  He specifically denies dysuria, gross hematuria or incontinence.  His pre-implant score was 5. He denies any abdominal pain or bowel symptoms.  He reports a healthy appetite and is maintaining his weight.  He denies any significant impact on his energy level and has remained active, getting his garden ready for the spring.  Overall, he is quite pleased with his progress to date.  ALLERGIES:  has No Known Allergies.  Meds: Current Outpatient Medications  Medication Sig Dispense Refill  . amLODipine (NORVASC) 5 MG tablet Take 5 mg by mouth at bedtime.    Marland Kitchen atorvastatin (LIPITOR) 10 MG tablet Take 10 mg by mouth daily.    . chlorproMAZINE (THORAZINE) 25 MG tablet Take 25 mg by mouth every 6 (six) hours as needed.    Marland Kitchen losartan-hydrochlorothiazide (HYZAAR) 100-25 MG tablet Take 1 tablet by mouth daily.     No current facility-administered medications for this encounter.    Physical Findings: In general this is a  well appearing African American male in no acute distress. He's alert and oriented x4 and appropriate throughout the examination. Cardiopulmonary assessment is negative for acute distress and he exhibits normal effort.   Lab Findings: Lab Results  Component Value Date   WBC 6.8 06/26/2020   HGB 14.6 06/26/2020   HCT 43.8 06/26/2020   MCV 88.3 06/26/2020   PLT 275 06/26/2020    Radiographic Findings:  Patient underwent CT imaging in our clinic for post implant dosimetry. The CT will be reviewed by Dr. Tammi Klippel to confirm there is an adequate distribution of radioactive seeds throughout the prostate gland and ensure that there are no seeds in or near the rectum. We suspect the final radiation plan and dosimetry will show appropriate coverage of the prostate gland. He understands that we will call and inform him of any unexpected findings on further review of his imaging and dosimetry.  Impression/Plan: 70 y.o. gentleman with Stage T1c adenocarcinoma of the prostate with Gleason score of 3+4, and PSA of 6.8. The patient is recovering from the effects of radiation. His urinary symptoms should gradually improve over the next 4-6 months. We talked about this today. He is encouraged by his improvement already and is otherwise pleased with his outcome. We also talked about long-term follow-up for prostate cancer following seed implant. He understands that ongoing PSA determinations and digital rectal exams will help perform surveillance to rule out disease recurrence. He has a follow up appointment scheduled with [facility] on 09/26/2018. He understands what to expect with his PSA  measures. Patient was also educated today about some of the long-term effects from radiation including a small risk for rectal bleeding and possibly erectile dysfunction. We talked about some of the general management approaches to these potential complications. However, I did encourage the patient to contact our office or return at  any point if he has questions or concerns related to his previous radiation and prostate cancer.    Nicholos Johns, PA-C

## 2020-07-26 NOTE — Progress Notes (Signed)
  Radiation Oncology         [phone] ________________________________  Name: Kent Smith MRN: [id number]  Date: 07/26/2020  DOB: [birth year]  COMPLEX SIMULATION NOTE  NARRATIVE:  The patient was brought to the [facility] today following prostate seed implantation approximately one month ago.  Identity was confirmed.  All relevant records and images related to the planned course of therapy were reviewed.  Then, the patient was set-up supine.  CT images were obtained.  The CT images were loaded into the planning software.  Then the prostate, SpaceOAR and rectum were contoured.  Treatment planning then occurred.  The implanted iodine 125 seeds were identified by the physics staff for projection of radiation distribution  I have requested : 3D Simulation  I have requested a DVH of the following structures: Prostate and rectum.    ________________________________  Sheral Apley Tammi Klippel, M.D.

## 2020-08-16 ENCOUNTER — Encounter: Payer: Self-pay | Admitting: Radiation Oncology

## 2020-08-16 DIAGNOSIS — C61 Malignant neoplasm of prostate: Secondary | ICD-10-CM | POA: Diagnosis not present

## 2020-08-23 DIAGNOSIS — C61 Malignant neoplasm of prostate: Secondary | ICD-10-CM | POA: Diagnosis not present

## 2020-08-23 DIAGNOSIS — I10 Essential (primary) hypertension: Secondary | ICD-10-CM | POA: Diagnosis not present

## 2020-09-22 DIAGNOSIS — I10 Essential (primary) hypertension: Secondary | ICD-10-CM | POA: Diagnosis not present

## 2020-09-22 DIAGNOSIS — C61 Malignant neoplasm of prostate: Secondary | ICD-10-CM | POA: Diagnosis not present

## 2020-09-24 NOTE — Progress Notes (Signed)
  Radiation Oncology         [phone] ________________________________  Name: Kent Smith MRN: [id number]  Date: 08/16/2020  DOB: 1950-11-03  3D Planning Note   Prostate Brachytherapy Post-Implant Dosimetry  Diagnosis:  70 y.o. gentleman with Stage T1c adenocarcinoma of the prostate with Gleason score of 3+4, and PSA of 6.8.  Narrative: On a previous date, Kent Smith returned following prostate seed implantation for post implant planning. He underwent CT scan complex simulation to delineate the three-dimensional structures of the pelvis and demonstrate the radiation distribution.  Since that time, the seed localization, and complex isodose planning with dose volume histograms have now been completed.  Results:   Prostate Coverage - The dose of radiation delivered to the 90% or more of the prostate gland (D90) was 86.1% of the prescription dose. This is very close to our goal of greater than 90%, and the involved portion of the prostate (left mid and left apex) is well covered. Rectal Sparing - The volume of rectal tissue receiving the prescription dose or higher was 0.0 cc. This falls under our thresholds tolerance of 1.0 cc.  Impression: The prostate seed implant appears to show adequate target coverage and appropriate rectal sparing.  Plan:  The patient will continue to follow with urology for ongoing PSA determinations. I would anticipate a high likelihood for local tumor control with minimal risk for rectal morbidity.  ________________________________  Sheral Apley Tammi Klippel, M.D.

## 2020-09-25 ENCOUNTER — Ambulatory Visit (INDEPENDENT_AMBULATORY_CARE_PROVIDER_SITE_OTHER): Payer: No Typology Code available for payment source | Admitting: Urology

## 2020-09-25 ENCOUNTER — Encounter: Payer: Self-pay | Admitting: Urology

## 2020-09-25 ENCOUNTER — Other Ambulatory Visit: Payer: Self-pay

## 2020-09-25 VITALS — BP 125/73 | HR 91 | Temp 98.5°F

## 2020-09-25 DIAGNOSIS — C61 Malignant neoplasm of prostate: Secondary | ICD-10-CM

## 2020-09-25 LAB — URINALYSIS, ROUTINE W REFLEX MICROSCOPIC
Bilirubin, UA: NEGATIVE
Glucose, UA: NEGATIVE
Ketones, UA: NEGATIVE
Leukocytes,UA: NEGATIVE
Nitrite, UA: NEGATIVE
Protein,UA: NEGATIVE
RBC, UA: NEGATIVE
Specific Gravity, UA: >1.03 — ABNORMAL HIGH (ref 1.005–1.030)
Urobilinogen, Ur: 0.2 mg/dL (ref 0.2–1.0)
pH, UA: 6.5 (ref 5.0–7.5)

## 2020-09-25 NOTE — Progress Notes (Signed)
History of Present Illness: Here for follow-up of prostate cancer, following therapy.  He underwent ultrasound and biopsy of his prostate on8.31.2021. At that time, PSA 5.3, prostate volume 27 mL, PSA density 0.20.  2/12 cores revealed adenocarcinoma-left mid medial core GS 3+4 and 40% of core, left apex medial, GS 3+4 in 40% of sample.  2.11.2022: He underwent I-125 brachytherapy and SpaceOAR placement.  5.10.2022: He returns today for his first postoperative visit.  Except for a bit of dysuria for a week or 2 afterwards he is done well.  He has minimal urinary symptomatology.  He is not on any medical therapy for BPH.  He denies any gross hematuria or blood per rectum.    Past Medical History:  Diagnosis Date  . History of colitis 11/2019   ED visit in epic  . Hyperlipidemia   . Hypertension    followed by pcp  . Prostate cancer [facility]) urologist--- dr Talik Casique/  oncologist--- dr Tammi Klippel   dx 08/ 2021,  Stage T1c, Gleason 3+4  . Wears glasses     Past Surgical History:  Procedure Laterality Date  . CYSTOSCOPY  06/29/2020   Procedure: CYSTOSCOPY FLEXIBLE;  Surgeon: Franchot Gallo, MD;  Location: [facility];  Service: Urology;;  no seeds found in bladder  . INGUINAL HERNIA REPAIR [facility] 10-21-2002  @[facility]   . PROSTATE BIOPSY  08/ 2021 in urologist office  . RADIOACTIVE SEED IMPLANT N/A 06/29/2020   Procedure: RADIOACTIVE SEED IMPLANT/BRACHYTHERAPY IMPLANT;  Surgeon: Franchot Gallo, MD;  Location: [facility];  Service: Urology;  Laterality: N/A;    67  seeds implanted  . SPACE OAR INSTILLATION N/A 06/29/2020   Procedure: SPACE OAR INSTILLATION;  Surgeon: Franchot Gallo, MD;  Location: [facility];  Service: Urology;  Laterality: N/A;    Home Medications:  Allergies as of 09/25/2020   No Known Allergies     Medication List       Accurate as of Sep 25, 2020  4:26 PM. If you have any questions, ask your nurse or doctor.         amLODipine 5 MG tablet Commonly known as: NORVASC Take 5 mg by mouth at bedtime.   atorvastatin 10 MG tablet Commonly known as: LIPITOR Take 10 mg by mouth daily.   chlorproMAZINE 25 MG tablet Commonly known as: THORAZINE Take 25 mg by mouth every 6 (six) hours as needed.   losartan-hydrochlorothiazide 100-25 MG tablet Commonly known as: HYZAAR Take 1 tablet by mouth daily.       Allergies: No Known Allergies  Family History  Problem Relation Age of Onset  . Hypertension Father   . CAD Father   . Cancer Father        type unknown  . Prostate cancer Neg Hx   . Colon cancer Neg Hx   . Pancreatic cancer Neg Hx   . Breast cancer Neg Hx     Social History:  reports that he quit smoking about 34 years ago. His smoking use included cigarettes. He has a 18.00 pack-year smoking history. He has never used smokeless tobacco. He reports current alcohol use of about 2.0 - 3.0 standard drinks of alcohol per week. He reports previous drug use. Drug: Marijuana.  ROS: A complete review of systems was performed.  All systems are negative except for pertinent findings as noted.  Physical Exam:  Vital signs in last 24 hours: BP 125/73   Pulse 91   Temp 98.5 F (36.9 C)  Constitutional:  Alert and oriented, No acute distress Cardiovascular: Regular rate  Respiratory: Normal respiratory effort Neurologic: Grossly intact, no focal deficits Psychiatric: Normal mood and affect  I have reviewed prior pt notes  I have reviewed notes from referring/previous physicians  I have reviewed urinalysis results    I have reviewed prior pathology results      Impression/Assessment:  Favorable intermediate risk prostate cancer, status post brachytherapy on 2.11.2022--doing well  Plan:  His PSA was checked today  I will bring him back in 4 months for repeat DRE/PSA/follow-up

## 2020-09-25 NOTE — Progress Notes (Signed)

## 2020-09-25 NOTE — Progress Notes (Signed)
History of Present Illness: This man is here today for follow-up of prostate cancer.   He underwent ultrasound and biopsy of his prostate on8.31.2021. At that time, PSA 5.3, prostate volume 27 mL, PSA density 0.20.  2/12 cores revealed adenocarcinoma-left mid medial core GS 3+4 and 40% of core, left apex medial, GS 3+4 in 40% of sample.  2.11.2022: He underwent I-125 brachytherapy/SpaceOAR instillation  5.10.2022: He returns for his first visit after his brachytherapy.  Overall, he had no real issues with that other than some dysuria for the first week or 2.  Currently, he has no lower urinary tract complaints.  IPSS 10, quality-of-life score 2.  Not on any medical therapy.  He has not had PSA drawn yet.  Past Medical History:  Diagnosis Date  . History of colitis 11/2019   ED visit in epic  . Hyperlipidemia   . Hypertension    followed by pcp  . Prostate cancer [facility]) urologist--- dr Achsah Mcquade/  oncologist--- dr Tammi Klippel   dx 08/ 2021,  Stage T1c, Gleason 3+4  . Wears glasses     Past Surgical History:  Procedure Laterality Date  . CYSTOSCOPY  06/29/2020   Procedure: CYSTOSCOPY FLEXIBLE;  Surgeon: Franchot Gallo, MD;  Location: [facility];  Service: Urology;;  no seeds found in bladder  . INGUINAL HERNIA REPAIR Left 10-21-2002  @[facility]   . PROSTATE BIOPSY  08/ 2021 in urologist office  . RADIOACTIVE SEED IMPLANT N/A 06/29/2020   Procedure: RADIOACTIVE SEED IMPLANT/BRACHYTHERAPY IMPLANT;  Surgeon: Franchot Gallo, MD;  Location: [facility];  Service: Urology;  Laterality: N/A;    67  seeds implanted  . SPACE OAR INSTILLATION N/A 06/29/2020   Procedure: SPACE OAR INSTILLATION;  Surgeon: Franchot Gallo, MD;  Location: [facility];  Service: Urology;  Laterality: N/A;    Home Medications:  Allergies as of 09/25/2020   No Known Allergies     Medication List       Accurate as of Sep 25, 2020 11:10 AM. If you have any questions,  ask your nurse or doctor.        amLODipine 5 MG tablet Commonly known as: NORVASC Take 5 mg by mouth at bedtime.   atorvastatin 10 MG tablet Commonly known as: LIPITOR Take 10 mg by mouth daily.   chlorproMAZINE 25 MG tablet Commonly known as: THORAZINE Take 25 mg by mouth every 6 (six) hours as needed.   losartan-hydrochlorothiazide 100-25 MG tablet Commonly known as: HYZAAR Take 1 tablet by mouth daily.       Allergies: No Known Allergies  Family History  Problem Relation Age of Onset  . Hypertension Father   . CAD Father   . Cancer Father        type unknown  . Prostate cancer Neg Hx   . Colon cancer Neg Hx   . Pancreatic cancer Neg Hx   . Breast cancer Neg Hx     Social History:  reports that he quit smoking about 34 years ago. His smoking use included cigarettes. He has a 18.00 pack-year smoking history. He has never used smokeless tobacco. He reports current alcohol use of about 2.0 - 3.0 standard drinks of alcohol per week. He reports previous drug use. Drug: Marijuana.  ROS: A complete review of systems was performed.  All systems are negative except for pertinent findings as noted.  Physical Exam:  Vital signs in last 24 hours: BP 125/73   Pulse 91   Temp 98.5 F (  36.9 C)  Constitutional:  Alert and oriented, No acute distress Cardiovascular: Regular rate  Respiratory: Normal respiratory effort Neurologic: Grossly intact, no focal deficits Psychiatric: Normal mood and affect  I have reviewed prior pt notes  I have reviewed urinalysis results  I have reviewed prior pathology results   Impression/Assessment:  GG 2 adenocarcinoma the prostate, status post brachytherapy 3 months ago.  Overall he is doing quite well  Plan:  I will check his PSA today  I will have him return in about 4 months for recheck

## 2020-09-26 LAB — PSA: Prostate Specific Ag, Serum: 3.5 ng/mL (ref 0.0–4.0)

## 2020-09-27 NOTE — Progress Notes (Signed)
Sent via mychart

## 2020-10-18 ENCOUNTER — Other Ambulatory Visit: Payer: Self-pay

## 2020-10-19 ENCOUNTER — Other Ambulatory Visit: Payer: Self-pay

## 2020-10-19 NOTE — Patient Outreach (Addendum)
[facility]) Care Management  10/19/2020  Kent Smith [birth year] [id number]   Telephone Screen  Referral Date: 10/18/2020 Referral Source: Join EMMI campaign Referral Reason: "prostate CA, HTN" Insurance: [employer] Medicare Initial Assessment  Outreach attempt # 1 to patient. Spoke with patient and initial assessment completed.    Social: Patient resides in the home along with his brother. He reports he has good family support with brother and nephew assisting him as needed and taking him to MD appts. He is independent with ADLs/IADLs-except no longer drives. Denies any recent falls or assistive device usage.    Conditions: Per chart review, patient has PMH that includes but not limited to prostatet CA(2021) s/p brachytehrapy, HTN, HLD. Patient reports last cancer scan showed that his cancer was doing well and he is currently not undergoing any treatments but sess oncologist regularly.  Medications: Med review completed with patient. Patient denies any issues managing and/or affording meds at this time.  Medications Reviewed Today    Reviewed by Hayden Pedro, RN (Registered Nurse) on 10/19/20 at [place] List Status: <None>  Medication Order Taking? Sig Documenting Provider Last Dose Status Informant  amLODipine (NORVASC) 5 MG tablet [id number] No Take 5 mg by mouth at bedtime. [provider] Taking Active Self  atorvastatin (LIPITOR) 10 MG tablet [id number] No Take 10 mg by mouth daily. [provider] Taking Active Self  chlorproMAZINE (THORAZINE) 25 MG tablet [id number] No Take 25 mg by mouth every 6 (six) hours as needed. [provider] Taking Active   losartan-hydrochlorothiazide (HYZAAR) 100-25 MG tablet [id number] No Take 1 tablet by mouth daily. [provider] Taking Active Self          Depression screen [facility] 2/9 10/19/2020  Decreased Interest 0  Down, Depressed, Hopeless 0  PHQ - 2 Score 0    Fall  Risk  10/19/2020  Falls in the past year? 0  Number falls in past yr: 0  Injury with Fall? 0  Risk for fall due to : Medication side effect  Follow up Falls evaluation completed;Education provided    SDOH Screenings   Alcohol Screen: Not on file  Depression (PHQ2-9): Low Risk   . PHQ-2 Score: 0  Financial Resource Strain: Not on file  Food Insecurity: No Food Insecurity  . Worried About Charity fundraiser in the Last Year: Never true  . Ran Out of Food in the Last Year: Never true  Housing: Not on file  Physical Activity: Not on file  Social Connections: Not on file  Stress: Not on file  Tobacco Use: Medium Risk  . Smoking Tobacco Use: Former Smoker  . Smokeless Tobacco Use: Never Used  Transportation Needs: No Transportation Needs  . Lack of Transportation (Medical): No  . Lack of Transportation (Non-Medical): No     Consent:THN services reviewed and discussed with patient. Verbal consent for services given.  Goals Addressed              This Visit's Progress   .  (THN)Track and Manage My Blood Pressure-Hypertension (pt-stated)        Timeframe:  Long-Range Goal Priority:  High Start Date: 10/19/2020                            Expected End Date:  Sept 2022                     Follow Up  Date August 2022  Barriers: Health Behaviors Knowledge    - check blood pressure 3 times per week - write blood pressure results in a log or diary    Why is this important?    You won't feel high blood pressure, but it can still hurt your blood vessels.   High blood pressure can cause heart or kidney problems. It can also cause a stroke.   Making lifestyle changes like losing a little weight or eating less salt will help.   Checking your blood pressure at home and at different times of the day can help to control blood pressure.   If the doctor prescribes medicine remember to take it the way the doctor ordered.   Call the office if you cannot afford the medicine or if  there are questions about it.     Notes:  10/19/2020-Patient reports that hs is checking BP in the home about 2-3x/wk. BP ranging in the 130-140s.      Plan:  RN CM discussed with patient next outreach within the month of Aug. Patient gave verbal consent and in agreement with RN CM follow up and timeframe. Patient aware that they may contact RN CM sooner for any issues or concerns. RN CM reviewed goals and plan of care with patient. Patient agrees to care plan and follow up. RN CM will send barriers letter and route encounter to PCP. RN CM will send welcome letter to patient.  Enzo Montgomery, RN,BSN,CCM [place] [facility] Telephonic Care Management Coordinator Direct Phone: [phone] Toll Free: [phone] Fax: [phone]

## 2020-10-23 DIAGNOSIS — Z1389 Encounter for screening for other disorder: Secondary | ICD-10-CM | POA: Diagnosis not present

## 2020-10-23 DIAGNOSIS — Z0001 Encounter for general adult medical examination with abnormal findings: Secondary | ICD-10-CM | POA: Diagnosis not present

## 2020-10-23 DIAGNOSIS — I10 Essential (primary) hypertension: Secondary | ICD-10-CM | POA: Diagnosis not present

## 2020-10-23 DIAGNOSIS — E785 Hyperlipidemia, unspecified: Secondary | ICD-10-CM | POA: Diagnosis not present

## 2020-10-23 DIAGNOSIS — Z1331 Encounter for screening for depression: Secondary | ICD-10-CM | POA: Diagnosis not present

## 2020-10-23 DIAGNOSIS — C61 Malignant neoplasm of prostate: Secondary | ICD-10-CM | POA: Diagnosis not present

## 2020-10-24 DIAGNOSIS — E785 Hyperlipidemia, unspecified: Secondary | ICD-10-CM | POA: Diagnosis not present

## 2020-10-24 DIAGNOSIS — C61 Malignant neoplasm of prostate: Secondary | ICD-10-CM | POA: Diagnosis not present

## 2020-10-24 DIAGNOSIS — Z6830 Body mass index (BMI) 30.0-30.9, adult: Secondary | ICD-10-CM | POA: Diagnosis not present

## 2020-10-24 DIAGNOSIS — I10 Essential (primary) hypertension: Secondary | ICD-10-CM | POA: Diagnosis not present

## 2020-11-07 ENCOUNTER — Encounter: Payer: Self-pay | Admitting: Internal Medicine

## 2020-11-22 DIAGNOSIS — I10 Essential (primary) hypertension: Secondary | ICD-10-CM | POA: Diagnosis not present

## 2020-11-22 DIAGNOSIS — E785 Hyperlipidemia, unspecified: Secondary | ICD-10-CM | POA: Diagnosis not present

## 2020-12-18 ENCOUNTER — Other Ambulatory Visit: Payer: Self-pay

## 2020-12-18 NOTE — Patient Outreach (Signed)
[facility]) Care Management  12/18/2020  Kent Smith 1951/03/20 [id number]   Telephone Assessment   Successful outreach call placed to patient. He denies any acute issues or concerns at present. Sates things have ben going well for him. Patient continues to work several days a week  about 5hrs/day in [employer]. He remain independent with ADLs/IADLs. NO recent falls. Wgt stable. He reports he is scheduled for routine colonscopy consult later this week to see if he needs to undergo procedure. He states he has never had one before.   Medications Reviewed Today     Reviewed by Hayden Pedro, RN (Registered Nurse) on 12/18/20 at [facility] List Status: <None>   Medication Order Taking? Sig Documenting Provider Last Dose Status Informant  amLODipine (NORVASC) 5 MG tablet [id number] No Take 5 mg by mouth at bedtime. [provider] Taking Active Self  atorvastatin (LIPITOR) 10 MG tablet [id number] No Take 10 mg by mouth daily. [provider] Taking Active Self  chlorproMAZINE (THORAZINE) 25 MG tablet [id number] No Take 25 mg by mouth every 6 (six) hours as needed. [provider] Taking Active   losartan-hydrochlorothiazide (HYZAAR) 100-25 MG tablet [id number] No Take 1 tablet by mouth daily. [provider] Taking Active Self             Goals Addressed               This Visit's Progress     (THN)Track and Manage My Blood Pressure-Hypertension (pt-stated)        Timeframe:  Long-Range Goal Priority:  High Start Date: 10/19/2020                            Expected End Date:  Sept 2022                     Follow Up Date  Oct 2022  Barriers: Health Behaviors Knowledge    - check blood pressure 3 times per week - write blood pressure results in a log or diary    Why is this important?   You won't feel high blood pressure, but it can still hurt your blood vessels.  High blood pressure can cause  heart or kidney problems. It can also cause a stroke.  Making lifestyle changes like losing a little weight or eating less salt will help.  Checking your blood pressure at home and at different times of the day can help to control blood pressure.  If the doctor prescribes medicine remember to take it the way the doctor ordered.  Call the office if you cannot afford the medicine or if there are questions about it.     Notes:  10/19/2020-Patient reports that hs is checking BP in the home about 2-3x/wk. BP ranging in the 130-140s.  12/18/20-Patient reports his B readings have ben in the 130s. He is pleased at how well he hs been able to manage BP thus far. He is checking BP about 3-4x/wk. He voices he is trying to adhere to low salt diet.           Plan: RN CM discussed with patient next outreach within the month of Oct. Patient gave verbal consent and in agreement with RN CM follow up and timeframe. Patient aware that they may contact RN CM sooner for any issues or concerns. RN CM reviewed goals and plan of care with patient. Patient agrees  to care plan and follow up.   Enzo Montgomery, RN,BSN,CCM [place] [facility] Telephonic Care Management Coordinator Direct Phone: [phone] Toll Free: [phone] Fax: [phone]

## 2020-12-20 ENCOUNTER — Encounter: Payer: Self-pay | Admitting: *Deleted

## 2020-12-20 ENCOUNTER — Telehealth: Payer: Self-pay | Admitting: *Deleted

## 2020-12-20 ENCOUNTER — Ambulatory Visit (INDEPENDENT_AMBULATORY_CARE_PROVIDER_SITE_OTHER): Payer: Medicare HMO | Admitting: Internal Medicine

## 2020-12-20 ENCOUNTER — Encounter: Payer: Self-pay | Admitting: Internal Medicine

## 2020-12-20 ENCOUNTER — Other Ambulatory Visit: Payer: Self-pay

## 2020-12-20 VITALS — BP 144/81 | HR 79 | Temp 97.7°F | Ht 68.0 in | Wt 191.8 lb

## 2020-12-20 DIAGNOSIS — R195 Other fecal abnormalities: Secondary | ICD-10-CM | POA: Diagnosis not present

## 2020-12-20 DIAGNOSIS — K625 Hemorrhage of anus and rectum: Secondary | ICD-10-CM | POA: Diagnosis not present

## 2020-12-20 NOTE — Progress Notes (Signed)
Referring Provider: Rosita Fire, MD Primary Care Physician:  Rosita Fire, MD Primary GI:  Dr. Abbey Chatters  Chief Complaint  Patient presents with   POSITIVE FIT TEST    Has had some rectal bleeding thinks relates to hemorrhoids.    HPI:   Kent Smith is a 70 y.o. male who presents to the clinic today to discuss colonoscopy.  Previously seen in September 2021 due to positive FIT testing.  We scheduled him to undergo colonoscopy, but this was delayed as patient had a new diagnosis of prostate cancer requiring treatment.  States he is ready to schedule today.  No family history of colorectal malignancy.  No previous colonoscopy.  No melena.  Does note bright red blood per rectum with bowel movements.  Believes this is due to hemorrhoids.  No unintentional weight loss.  Denies any upper GI symptoms including heartburn, reflux, dysphagia, odynophagia, epigastric pain.  Otherwise no other complaints.  Past Medical History:  Diagnosis Date   History of colitis 11/2019   ED visit in epic   Hyperlipidemia    Hypertension    followed by pcp   Prostate cancer [facility]) urologist--- dr dahlstedt/  oncologist--- dr Tammi Klippel   dx 08/ 2021,  Stage T1c, Gleason 3+4   Wears glasses     Past Surgical History:  Procedure Laterality Date   CYSTOSCOPY  06/29/2020   Procedure: CYSTOSCOPY FLEXIBLE;  Surgeon: Franchot Gallo, MD;  Location: [facility];  Service: Urology;;  no seeds found in bladder   INGUINAL HERNIA REPAIR Left 10-21-2002  '@[facility]    PROSTATE BIOPSY  08/ 2021 in urologist office   RADIOACTIVE SEED IMPLANT N/A 06/29/2020   Procedure: RADIOACTIVE SEED IMPLANT/BRACHYTHERAPY IMPLANT;  Surgeon: Franchot Gallo, MD;  Location: [facility];  Service: Urology;  Laterality: N/A;    67  seeds implanted   SPACE OAR INSTILLATION N/A 06/29/2020   Procedure: SPACE OAR INSTILLATION;  Surgeon: Franchot Gallo, MD;  Location: [facility];  Service:  Urology;  Laterality: N/A;    Current Outpatient Medications  Medication Sig Dispense Refill   amLODipine (NORVASC) 5 MG tablet Take 5 mg by mouth at bedtime.     atorvastatin (LIPITOR) 10 MG tablet Take 10 mg by mouth daily.     chlorproMAZINE (THORAZINE) 25 MG tablet Take 25 mg by mouth every 6 (six) hours as needed.     losartan-hydrochlorothiazide (HYZAAR) 100-25 MG tablet Take 1 tablet by mouth daily.     No current facility-administered medications for this visit.    Allergies as of 12/20/2020   (No Known Allergies)    Family History  Problem Relation Age of Onset   Hypertension Father    CAD Father    Cancer Father        type unknown   Prostate cancer Neg Hx    Colon cancer Neg Hx    Pancreatic cancer Neg Hx    Breast cancer Neg Hx     Social History   Socioeconomic History   Marital status: Widowed    Spouse name: Not on file   Number of children: Not on file   Years of education: Not on file   Highest education level: Not on file  Occupational History    Comment: continues to work full time  Tobacco Use   Smoking status: Former    Packs/day: 1.00    Years: 18.00    Pack years: 18.00    Types: Cigarettes    Quit  date: 05/19/1986    Years since quitting: 34.6   Smokeless tobacco: Never  Vaping Use   Vaping Use: Never used  Substance and Sexual Activity   Alcohol use: Yes    Alcohol/week: 2.0 - 3.0 standard drinks    Types: 2 - 3 Cans of beer per week   Drug use: Not Currently    Types: Marijuana    Comment: 06-26-2020  per pt once in a awhile,  last smoked "week"  appor.x 6 months ago   Sexual activity: Not Currently  Other Topics Concern   Not on file  Social History Narrative   Not on file   Social Determinants of Health   Financial Resource Strain: Not on file  Food Insecurity: No Food Insecurity   Worried About Running Out of Food in the Last Year: Never true   Ran Out of Food in the Last Year: Never true  Transportation Needs: No  Transportation Needs   Lack of Transportation (Medical): No   Lack of Transportation (Non-Medical): No  Physical Activity: Not on file  Stress: Not on file  Social Connections: Not on file    Subjective: Review of Systems  Constitutional:  Negative for chills and fever.  HENT:  Negative for congestion and hearing loss.   Eyes:  Negative for blurred vision and double vision.  Respiratory:  Negative for cough and shortness of breath.   Cardiovascular:  Negative for chest pain and palpitations.  Gastrointestinal:  Positive for blood in stool. Negative for abdominal pain, constipation, diarrhea, heartburn, melena and vomiting.  Genitourinary:  Negative for dysuria and urgency.  Musculoskeletal:  Negative for joint pain and myalgias.  Skin:  Negative for itching and rash.  Neurological:  Negative for dizziness and headaches.  Psychiatric/Behavioral:  Negative for depression. The patient is not nervous/anxious.     Objective: BP (!) 144/81   Pulse 79   Temp 97.7 F (36.5 C)   Ht '5\' 8"'$  (1.727 m)   Wt 191 lb 12.8 oz (87 kg)   BMI 29.16 kg/m  Physical Exam Constitutional:      Appearance: Normal appearance.  HENT:     Head: Normocephalic and atraumatic.  Eyes:     Extraocular Movements: Extraocular movements intact.     Conjunctiva/sclera: Conjunctivae normal.  Cardiovascular:     Rate and Rhythm: Normal rate and regular rhythm.  Pulmonary:     Effort: Pulmonary effort is normal.     Breath sounds: Normal breath sounds.  Abdominal:     General: Bowel sounds are normal.     Palpations: Abdomen is soft.  Musculoskeletal:        General: Normal range of motion.     Cervical back: Normal range of motion and neck supple.  Skin:    General: Skin is warm.  Neurological:     General: No focal deficit present.     Mental Status: He is alert and oriented to person, place, and time.  Psychiatric:        Mood and Affect: Mood normal.        Behavior: Behavior normal.      Assessment: *Rectal bleeding *Positive FIT test  Plan: Will schedule for diagnostic colonoscopy.The risks including infection, bleed, or perforation as well as benefits, limitations, alternatives and imponderables have been reviewed with the patient. Questions have been answered. All parties agreeable.  Further recommendations to follow.   12/20/2020 2:16 PM   Disclaimer: This note was dictated with voice recognition software. Similar sounding words  can inadvertently be transcribed and may not be corrected upon review.

## 2020-12-20 NOTE — H&P (View-Only) (Signed)
Referring Provider: Rosita Fire, MD Primary Care Physician:  Rosita Fire, MD Primary GI:  Dr. Abbey Chatters  Chief Complaint  Patient presents with   POSITIVE FIT TEST    Has had some rectal bleeding thinks relates to hemorrhoids.    HPI:   Kent Smith is a 70 y.o. male who presents to the clinic today to discuss colonoscopy.  Previously seen in September 2021 due to positive FIT testing.  We scheduled him to undergo colonoscopy, but this was delayed as patient had a new diagnosis of prostate cancer requiring treatment.  States he is ready to schedule today.  No family history of colorectal malignancy.  No previous colonoscopy.  No melena.  Does note bright red blood per rectum with bowel movements.  Believes this is due to hemorrhoids.  No unintentional weight loss.  Denies any upper GI symptoms including heartburn, reflux, dysphagia, odynophagia, epigastric pain.  Otherwise no other complaints.  Past Medical History:  Diagnosis Date   History of colitis 11/2019   ED visit in epic   Hyperlipidemia    Hypertension    followed by pcp   Prostate cancer [facility]) urologist--- dr dahlstedt/  oncologist--- dr Tammi Klippel   dx 08/ 2021,  Stage T1c, Gleason 3+4   Wears glasses     Past Surgical History:  Procedure Laterality Date   CYSTOSCOPY  06/29/2020   Procedure: CYSTOSCOPY FLEXIBLE;  Surgeon: Franchot Gallo, MD;  Location: [facility];  Service: Urology;;  no seeds found in bladder   INGUINAL HERNIA REPAIR Left 10-21-2002  '@[facility]'$    PROSTATE BIOPSY  08/ 2021 in urologist office   RADIOACTIVE SEED IMPLANT N/A 06/29/2020   Procedure: RADIOACTIVE SEED IMPLANT/BRACHYTHERAPY IMPLANT;  Surgeon: Franchot Gallo, MD;  Location: [facility];  Service: Urology;  Laterality: N/A;    67  seeds implanted   SPACE OAR INSTILLATION N/A 06/29/2020   Procedure: SPACE OAR INSTILLATION;  Surgeon: Franchot Gallo, MD;  Location: [facility];  Service:  Urology;  Laterality: N/A;    Current Outpatient Medications  Medication Sig Dispense Refill   amLODipine (NORVASC) 5 MG tablet Take 5 mg by mouth at bedtime.     atorvastatin (LIPITOR) 10 MG tablet Take 10 mg by mouth daily.     chlorproMAZINE (THORAZINE) 25 MG tablet Take 25 mg by mouth every 6 (six) hours as needed.     losartan-hydrochlorothiazide (HYZAAR) 100-25 MG tablet Take 1 tablet by mouth daily.     No current facility-administered medications for this visit.    Allergies as of 12/20/2020   (No Known Allergies)    Family History  Problem Relation Age of Onset   Hypertension Father    CAD Father    Cancer Father        type unknown   Prostate cancer Neg Hx    Colon cancer Neg Hx    Pancreatic cancer Neg Hx    Breast cancer Neg Hx     Social History   Socioeconomic History   Marital status: Widowed    Spouse name: Not on file   Number of children: Not on file   Years of education: Not on file   Highest education level: Not on file  Occupational History    Comment: continues to work full time  Tobacco Use   Smoking status: Former    Packs/day: 1.00    Years: 18.00    Pack years: 18.00    Types: Cigarettes    Quit  date: 05/19/1986    Years since quitting: 34.6   Smokeless tobacco: Never  Vaping Use   Vaping Use: Never used  Substance and Sexual Activity   Alcohol use: Yes    Alcohol/week: 2.0 - 3.0 standard drinks    Types: 2 - 3 Cans of beer per week   Drug use: Not Currently    Types: Marijuana    Comment: 06-26-2020  per pt once in a awhile,  last smoked "week"  appor.x 6 months ago   Sexual activity: Not Currently  Other Topics Concern   Not on file  Social History Narrative   Not on file   Social Determinants of Health   Financial Resource Strain: Not on file  Food Insecurity: No Food Insecurity   Worried About Running Out of Food in the Last Year: Never true   Ran Out of Food in the Last Year: Never true  Transportation Needs: No  Transportation Needs   Lack of Transportation (Medical): No   Lack of Transportation (Non-Medical): No  Physical Activity: Not on file  Stress: Not on file  Social Connections: Not on file    Subjective: Review of Systems  Constitutional:  Negative for chills and fever.  HENT:  Negative for congestion and hearing loss.   Eyes:  Negative for blurred vision and double vision.  Respiratory:  Negative for cough and shortness of breath.   Cardiovascular:  Negative for chest pain and palpitations.  Gastrointestinal:  Positive for blood in stool. Negative for abdominal pain, constipation, diarrhea, heartburn, melena and vomiting.  Genitourinary:  Negative for dysuria and urgency.  Musculoskeletal:  Negative for joint pain and myalgias.  Skin:  Negative for itching and rash.  Neurological:  Negative for dizziness and headaches.  Psychiatric/Behavioral:  Negative for depression. The patient is not nervous/anxious.     Objective: BP (!) 144/81   Pulse 79   Temp 97.7 F (36.5 C)   Ht '5\' 8"'$  (1.727 m)   Wt 191 lb 12.8 oz (87 kg)   BMI 29.16 kg/m  Physical Exam Constitutional:      Appearance: Normal appearance.  HENT:     Head: Normocephalic and atraumatic.  Eyes:     Extraocular Movements: Extraocular movements intact.     Conjunctiva/sclera: Conjunctivae normal.  Cardiovascular:     Rate and Rhythm: Normal rate and regular rhythm.  Pulmonary:     Effort: Pulmonary effort is normal.     Breath sounds: Normal breath sounds.  Abdominal:     General: Bowel sounds are normal.     Palpations: Abdomen is soft.  Musculoskeletal:        General: Normal range of motion.     Cervical back: Normal range of motion and neck supple.  Skin:    General: Skin is warm.  Neurological:     General: No focal deficit present.     Mental Status: He is alert and oriented to person, place, and time.  Psychiatric:        Mood and Affect: Mood normal.        Behavior: Behavior normal.      Assessment: *Rectal bleeding *Positive FIT test  Plan: Will schedule for diagnostic colonoscopy.The risks including infection, bleed, or perforation as well as benefits, limitations, alternatives and imponderables have been reviewed with the patient. Questions have been answered. All parties agreeable.  Further recommendations to follow.   12/20/2020 2:16 PM   Disclaimer: This note was dictated with voice recognition software. Similar sounding words  can inadvertently be transcribed and may not be corrected upon review.

## 2020-12-20 NOTE — Telephone Encounter (Signed)
Pt referred by Dr. Legrand Rams for TCS. PA done via [employer] for TCS. APPROVED  [id number], DOS 01/07/2021-02/06/2021

## 2020-12-20 NOTE — Patient Instructions (Signed)
We will schedule you for colonoscopy to further evaluate your positive fit test as well as rectal bleeding.  Further recommendations to follow.  At [facility] we value your feedback. You may receive a survey about your visit today. Please share your experience as we strive to create trusting relationships with our patients to provide genuine, compassionate, quality care.  We appreciate your understanding and patience as we review any laboratory studies, imaging, and other diagnostic tests that are ordered as we care for you. Our office policy is 5 business days for review of these results, and any emergent or urgent results are addressed in a timely manner for your best interest. If you do not hear from our office in 1 week, please contact us.   We also encourage the use of MyChart, which contains your medical information for your review as well. If you are not enrolled in this feature, an access code is on this after visit summary for your convenience. Thank you for allowing [place] to be involved in your care.  It was great to see you today!  I hope you have a great rest of your summer!!    Elon Alas. Abbey Chatters, D.O. Gastroenterology and Hepatology [facility]

## 2020-12-21 ENCOUNTER — Encounter: Payer: Self-pay | Admitting: *Deleted

## 2020-12-23 DIAGNOSIS — C61 Malignant neoplasm of prostate: Secondary | ICD-10-CM | POA: Diagnosis not present

## 2020-12-23 DIAGNOSIS — I10 Essential (primary) hypertension: Secondary | ICD-10-CM | POA: Diagnosis not present

## 2020-12-25 ENCOUNTER — Ambulatory Visit: Payer: No Typology Code available for payment source

## 2020-12-27 NOTE — Patient Instructions (Signed)
Kent Smith  12/27/2020     '@PREFPERIOPPHARMACY'$ @   Your procedure is scheduled on 01/07/2021.   Report to [facility] at  [place].[facility].   Call this number if you have problems the morning of surgery:  [phone]   Remember:  Follow the diet and prep instructions given to you by the office.    Take these medicines the morning of surgery with A SIP OF WATER                                  amlodipine.     Do not wear jewelry, make-up or nail polish.  Do not wear lotions, powders, or perfumes, or deodorant.  Do not shave 48 hours prior to surgery.  Men may shave face and neck.  Do not bring valuables to the hospital.  [facility] is not responsible for any belongings or valuables.  Contacts, dentures or bridgework may not be worn into surgery.  Leave your suitcase in the car.  After surgery it may be brought to your room.  For patients admitted to the hospital, discharge time will be determined by your treatment team.  Patients discharged the day of surgery will not be allowed to drive home and must have someone with them for 24 hours.    Special instructions:   DO NOT smoke tobacco or vape for 24 hours before your procedure.  Please read over the following fact sheets that you were given. Anesthesia Post-op Instructions and Care and Recovery After Surgery      Colonoscopy, Adult, Care After This sheet gives you information about how to care for yourself after your procedure. Your health care provider may also give you more specific instructions. If you have problems or questions, contact your health careprovider. What can I expect after the procedure? After the procedure, it is common to have: A small amount of blood in your stool for 24 hours after the procedure. Some gas. Mild cramping or bloating of your abdomen. Follow these instructions at home: Eating and drinking  Drink enough fluid to keep your urine pale yellow. Follow instructions from your  health care provider about eating or drinking restrictions. Resume your normal diet as instructed by your health care provider. Avoid heavy or fried foods that are hard to digest.  Activity Rest as told by your health care provider. Avoid sitting for a long time without moving. Get up to take short walks every 1-2 hours. This is important to improve blood flow and breathing. Ask for help if you feel weak or unsteady. Return to your normal activities as told by your health care provider. Ask your health care provider what activities are safe for you. Managing cramping and bloating  Try walking around when you have cramps or feel bloated. Apply heat to your abdomen as told by your health care provider. Use the heat source that your health care provider recommends, such as a moist heat pack or a heating pad. Place a towel between your skin and the heat source. Leave the heat on for 20-30 minutes. Remove the heat if your skin turns bright red. This is especially important if you are unable to feel pain, heat, or cold. You may have a greater risk of getting burned.  General instructions If you were given a sedative during the procedure, it can affect you for several hours. Do not drive or  operate machinery until your health care provider says that it is safe. For the first 24 hours after the procedure: Do not sign important documents. Do not drink alcohol. Do your regular daily activities at a slower pace than normal. Eat soft foods that are easy to digest. Take over-the-counter and prescription medicines only as told by your health care provider. Keep all follow-up visits as told by your health care provider. This is important. Contact a health care provider if: You have blood in your stool 2-3 days after the procedure. Get help right away if you have: More than a small spotting of blood in your stool. Large blood clots in your stool. Swelling of your abdomen. Nausea or vomiting. A  fever. Increasing pain in your abdomen that is not relieved with medicine. Summary After the procedure, it is common to have a small amount of blood in your stool. You may also have mild cramping and bloating of your abdomen. If you were given a sedative during the procedure, it can affect you for several hours. Do not drive or operate machinery until your health care provider says that it is safe. Get help right away if you have a lot of blood in your stool, nausea or vomiting, a fever, or increased pain in your abdomen. This information is not intended to replace advice given to you by your health care provider. Make sure you discuss any questions you have with your healthcare provider. Document Revised: 04/29/2019 Document Reviewed: 11/29/2018 Elsevier Patient Education  [place] After This sheet gives you information about how to care for yourself after your procedure. Your health care provider may also give you more specific instructions. If you have problems or questions, contact your health careprovider. What can I expect after the procedure? After the procedure, it is common to have: Tiredness. Forgetfulness about what happened after the procedure. Impaired judgment for important decisions. Nausea or vomiting. Some difficulty with balance. Follow these instructions at home: For the time period you were told by your health care provider:     Rest as needed. Do not participate in activities where you could fall or become injured. Do not drive or use machinery. Do not drink alcohol. Do not take sleeping pills or medicines that cause drowsiness. Do not make important decisions or sign legal documents. Do not take care of children on your own. Eating and drinking Follow the diet that is recommended by your health care provider. Drink enough fluid to keep your urine pale yellow. If you vomit: Drink water, juice, or soup when you can drink  without vomiting. Make sure you have little or no nausea before eating solid foods. General instructions Have a responsible adult stay with you for the time you are told. It is important to have someone help care for you until you are awake and alert. Take over-the-counter and prescription medicines only as told by your health care provider. If you have sleep apnea, surgery and certain medicines can increase your risk for breathing problems. Follow instructions from your health care provider about wearing your sleep device: Anytime you are sleeping, including during daytime naps. While taking prescription pain medicines, sleeping medicines, or medicines that make you drowsy. Avoid smoking. Keep all follow-up visits as told by your health care provider. This is important. Contact a health care provider if: You keep feeling nauseous or you keep vomiting. You feel light-headed. You are still sleepy or having trouble with balance after 24 hours. You  develop a rash. You have a fever. You have redness or swelling around the IV site. Get help right away if: You have trouble breathing. You have new-onset confusion at home. Summary For several hours after your procedure, you may feel tired. You may also be forgetful and have poor judgment. Have a responsible adult stay with you for the time you are told. It is important to have someone help care for you until you are awake and alert. Rest as told. Do not drive or operate machinery. Do not drink alcohol or take sleeping pills. Get help right away if you have trouble breathing, or if you suddenly become confused. This information is not intended to replace advice given to you by your health care provider. Make sure you discuss any questions you have with your healthcare provider. Document Revised: 01/19/2020 Document Reviewed: 04/07/2019 Elsevier Patient Education  2022 [employer].

## 2021-01-02 ENCOUNTER — Other Ambulatory Visit: Payer: Self-pay

## 2021-01-02 ENCOUNTER — Encounter (HOSPITAL_COMMUNITY)
Admission: RE | Admit: 2021-01-02 | Discharge: 2021-01-02 | Disposition: A | Discharge status: Home / Self Care | Payer: Medicare HMO | Source: Ambulatory Visit | Attending: Internal Medicine | Admitting: Internal Medicine

## 2021-01-02 ENCOUNTER — Encounter (HOSPITAL_COMMUNITY): Payer: Self-pay

## 2021-01-02 DIAGNOSIS — Z01812 Encounter for preprocedural laboratory examination: Secondary | ICD-10-CM | POA: Insufficient documentation

## 2021-01-02 LAB — BASIC METABOLIC PANEL
Anion gap: 9 (ref 5–15)
BUN: 19 mg/dL (ref 8–23)
CO2: 29 mmol/L (ref 22–32)
Calcium: 8.9 mg/dL (ref 8.9–10.3)
Chloride: 96 mmol/L — ABNORMAL LOW (ref 98–111)
Creatinine, Ser: 1.02 mg/dL (ref 0.61–1.24)
GFR, Estimated: >60 mL/min (ref >60)
Glucose, Bld: 88 mg/dL (ref 70–99)
Potassium: 3.3 mmol/L — ABNORMAL LOW (ref 3.5–5.1)
Sodium: 134 mmol/L — ABNORMAL LOW (ref 135–145)

## 2021-01-07 ENCOUNTER — Ambulatory Visit (HOSPITAL_COMMUNITY): Payer: Medicare HMO | Admitting: Anesthesiology

## 2021-01-07 ENCOUNTER — Encounter (HOSPITAL_COMMUNITY)
Admission: RE | Disposition: A | Discharge status: Home / Self Care | Payer: Self-pay | Source: Home / Self Care | Attending: Internal Medicine

## 2021-01-07 ENCOUNTER — Ambulatory Visit (HOSPITAL_COMMUNITY)
Admission: RE | Admit: 2021-01-07 | Discharge: 2021-01-07 | Disposition: A | Discharge status: Home / Self Care | Payer: Medicare HMO | Attending: Internal Medicine | Admitting: Internal Medicine

## 2021-01-07 ENCOUNTER — Encounter (HOSPITAL_COMMUNITY): Payer: Self-pay

## 2021-01-07 DIAGNOSIS — K648 Other hemorrhoids: Secondary | ICD-10-CM | POA: Insufficient documentation

## 2021-01-07 DIAGNOSIS — Z8546 Personal history of malignant neoplasm of prostate: Secondary | ICD-10-CM | POA: Diagnosis not present

## 2021-01-07 DIAGNOSIS — Z79899 Other long term (current) drug therapy: Secondary | ICD-10-CM | POA: Diagnosis not present

## 2021-01-07 DIAGNOSIS — Z87891 Personal history of nicotine dependence: Secondary | ICD-10-CM | POA: Insufficient documentation

## 2021-01-07 DIAGNOSIS — K625 Hemorrhage of anus and rectum: Secondary | ICD-10-CM | POA: Diagnosis not present

## 2021-01-07 DIAGNOSIS — K635 Polyp of colon: Secondary | ICD-10-CM | POA: Insufficient documentation

## 2021-01-07 DIAGNOSIS — R195 Other fecal abnormalities: Secondary | ICD-10-CM | POA: Diagnosis present

## 2021-01-07 DIAGNOSIS — D124 Benign neoplasm of descending colon: Secondary | ICD-10-CM | POA: Insufficient documentation

## 2021-01-07 HISTORY — PX: COLONOSCOPY WITH PROPOFOL: SHX5780

## 2021-01-07 HISTORY — PX: POLYPECTOMY: SHX5525

## 2021-01-07 SURGERY — COLONOSCOPY WITH PROPOFOL
Anesthesia: General

## 2021-01-07 MED ORDER — STERILE WATER FOR IRRIGATION IR SOLN
Status: DC | PRN
  Administered 2021-01-07: 100 mL

## 2021-01-07 MED ORDER — PROPOFOL 500 MG/50ML IV EMUL
INTRAVENOUS | Status: DC | PRN
  Administered 2021-01-07: 125 ug/kg/min via INTRAVENOUS

## 2021-01-07 MED ORDER — LACTATED RINGERS IV SOLN
INTRAVENOUS | Status: DC
  Administered 2021-01-07: 1000 mL via INTRAVENOUS

## 2021-01-07 MED ORDER — PROPOFOL 10 MG/ML IV BOLUS
INTRAVENOUS | Status: DC | PRN
  Administered 2021-01-07: 100 mg via INTRAVENOUS

## 2021-01-07 NOTE — Anesthesia Postprocedure Evaluation (Signed)
Anesthesia Post Note  Patient: Kent Smith  Procedure(s) Performed: COLONOSCOPY WITH PROPOFOL POLYPECTOMY  Patient location during evaluation: Phase II Anesthesia Type: General Level of consciousness: awake and alert and oriented Pain management: pain level controlled Vital Signs Assessment: post-procedure vital signs reviewed and stable Respiratory status: spontaneous breathing and respiratory function stable Cardiovascular status: blood pressure returned to baseline and stable Postop Assessment: no apparent nausea or vomiting Anesthetic complications: no   No notable events documented.   Last Vitals:  Vitals:   01/07/21 1018 01/07/21 1150  BP: 131/85 105/70  Pulse:  73  Resp: 20 11  Temp: 36.6 C   SpO2: 100% 99%    Last Pain:  Vitals:   01/07/21 1153  TempSrc:   PainSc: 0-No pain                 Cheyanne Lamison C Henry Demeritt

## 2021-01-07 NOTE — Anesthesia Preprocedure Evaluation (Signed)
Anesthesia Evaluation  Patient identified by MRN, date of birth, ID band Patient awake    Reviewed: Allergy & Precautions, NPO status , Patient's Chart, lab work & pertinent test results  History of Anesthesia Complications Negative for: history of anesthetic complications  Airway Mallampati: II  TM Distance: >3 FB Neck ROM: Full    Dental  (+) Dental Advisory Given, Missing, Poor Dentition, Chipped   Pulmonary neg pulmonary ROS, former smoker,    Pulmonary exam normal breath sounds clear to auscultation       Cardiovascular Exercise Tolerance: Good hypertension, Pt. on medications Normal cardiovascular exam Rhythm:Regular Rate:Normal     Neuro/Psych negative psych ROS   GI/Hepatic negative GI ROS, Neg liver ROS,   Endo/Other  negative endocrine ROS  Renal/GU negative Renal ROS   Prostate cancer    Musculoskeletal negative musculoskeletal ROS (+)   Abdominal   Peds  Hematology negative hematology ROS (+)   Anesthesia Other Findings   Reproductive/Obstetrics negative OB ROS                             Anesthesia Physical Anesthesia Plan  ASA: 2  Anesthesia Plan: General   Post-op Pain Management:    Induction: Intravenous  PONV Risk Score and Plan: TIVA  Airway Management Planned: Nasal Cannula and Natural Airway  Additional Equipment:   Intra-op Plan:   Post-operative Plan:   Informed Consent: I have reviewed the patients History and Physical, chart, labs and discussed the procedure including the risks, benefits and alternatives for the proposed anesthesia with the patient or authorized representative who has indicated his/her understanding and acceptance.     Dental advisory given  Plan Discussed with: CRNA and Surgeon  Anesthesia Plan Comments:         Anesthesia Quick Evaluation

## 2021-01-07 NOTE — Op Note (Signed)
[facility] Patient Name: Kent Smith Procedure Date: 01/07/2021 11:21 AM MRN: [id number] Date of Birth: 1950-09-27 Attending MD: Elon Alas. Abbey Chatters DO CSN: [id number] Age: 70 Admit Type: Outpatient Procedure:                Colonoscopy Indications:              Positive fecal immunochemical test Providers:                Elon Alas. Abbey Chatters, DO, Caprice Kluver, Aram Candela Referring MD:              Medicines:                See the Anesthesia note for documentation of the                            administered medications Complications:            No immediate complications. Estimated Blood Loss:     Estimated blood loss was minimal. Procedure:                Pre-Anesthesia Assessment:                           - The anesthesia plan was to use monitored                            anesthesia care (MAC).                           After obtaining informed consent, the colonoscope                            was passed under direct vision. Throughout the                            procedure, the patient's blood pressure, pulse, and                            oxygen saturations were monitored continuously. The                            [id number]-HQ190L ([id number]) scope was introduced through                            the anus and advanced to the the cecum, identified                            by appendiceal orifice and ileocecal valve. The                            colonoscopy was performed without difficulty. The                            patient tolerated the procedure well. The quality                            of the  bowel preparation was evaluated using the                            BBPS [facility] Bowel Preparation Scale) with scores                            of: Right Colon = 2 (minor amount of residual                            staining, small fragments of stool and/or opaque                            liquid, but mucosa seen well), Transverse Colon = 3                             (entire mucosa seen well with no residual staining,                            small fragments of stool or opaque liquid) and Left                            Colon = 3 (entire mucosa seen well with no residual                            staining, small fragments of stool or opaque                            liquid). The total BBPS score equals 8. The quality                            of the bowel preparation was good. Scope In: 11:31:13 AM Scope Out: 11:44:38 AM Scope Withdrawal Time: 0 hours 10 minutes 20 seconds  Total Procedure Duration: 0 hours 13 minutes 25 seconds  Findings:      The perianal and digital rectal examinations were normal.      Non-bleeding internal hemorrhoids were found during endoscopy.      Three sessile polyps were found in the descending colon. The polyps were       4 to 6 mm in size. These polyps were removed with a cold snare.       Resection and retrieval were complete. Impression:               - Non-bleeding internal hemorrhoids.                           - Three 4 to 6 mm polyps in the descending colon,                            removed with a cold snare. Resected and retrieved. Moderate Sedation:      Per Anesthesia Care Recommendation:           - Patient has a contact number available for  emergencies. The signs and symptoms of potential                            delayed complications were discussed with the                            patient. Return to normal activities tomorrow.                            Written discharge instructions were provided to the                            patient.                           - Resume previous diet.                           - Continue present medications.                           - Await pathology results.                           - Repeat colonoscopy in 5 years for surveillance.                           - Return to GI clinic PRN. Procedure Code(s):        ---  Professional ---                           [phone], Colonoscopy, flexible; with removal of                            tumor(s), polyp(s), or other lesion(s) by snare                            technique Diagnosis Code(s):        --- Professional ---                           K63.5, Polyp of colon                           K64.8, Other hemorrhoids                           R19.5, Other fecal abnormalities CPT copyright 2019 American Medical Association. All rights reserved. The codes documented in this report are preliminary and upon coder review may  be revised to meet current compliance requirements. Elon Alas. Abbey Chatters, DO [place] Abbey Chatters, DO 01/07/2021 11:48:48 AM This report has been signed electronically. Number of Addenda: 0

## 2021-01-07 NOTE — Interval H&P Note (Signed)
History and Physical Interval Note:  01/07/2021 10:51 AM  Kent Smith  has presented today for surgery, with the diagnosis of positive fit test.  The various methods of treatment have been discussed with the patient and family. After consideration of risks, benefits and other options for treatment, the patient has consented to  Procedure(s) with comments: COLONOSCOPY WITH PROPOFOL (N/A) - 11:45am as a surgical intervention.  The patient's history has been reviewed, patient examined, no change in status, stable for surgery.  I have reviewed the patient's chart and labs.  Questions were answered to the patient's satisfaction.     Eloise Harman

## 2021-01-07 NOTE — Discharge Instructions (Addendum)
You had 3 polyps removed, MD will call with results. Call GI office as you need to. You have non bleeding internal hemorrhoids.plan for repeat colonoscopy in 5 years.

## 2021-01-07 NOTE — Transfer of Care (Signed)
Immediate Anesthesia Transfer of Care Note  Patient: Kent Smith  Procedure(s) Performed: COLONOSCOPY WITH PROPOFOL POLYPECTOMY  Patient Location: Short Stay  Anesthesia Type:General  Level of Consciousness: awake  Airway & Oxygen Therapy: Patient Spontanous Breathing  Post-op Assessment: Report given to RN and Post -op Vital signs reviewed and stable  Post vital signs: Reviewed and stable  Last Vitals:  Vitals Value Taken Time  BP    Temp    Pulse    Resp    SpO2      Last Pain:  Vitals:   01/07/21 1129  TempSrc:   PainSc: 0-No pain      Patients Stated Pain Goal: 8 ([id number])  Complications: No notable events documented.

## 2021-01-08 LAB — SURGICAL PATHOLOGY

## 2021-01-14 ENCOUNTER — Other Ambulatory Visit: Payer: Self-pay

## 2021-01-14 ENCOUNTER — Other Ambulatory Visit: Payer: No Typology Code available for payment source

## 2021-01-14 DIAGNOSIS — C61 Malignant neoplasm of prostate: Secondary | ICD-10-CM

## 2021-01-15 LAB — PSA: Prostate Specific Ag, Serum: 2.3 ng/mL (ref 0.0–4.0)

## 2021-01-15 NOTE — Progress Notes (Signed)
Sent via mychart and mail. 

## 2021-01-16 ENCOUNTER — Encounter (HOSPITAL_COMMUNITY): Payer: Self-pay | Admitting: Internal Medicine

## 2021-01-21 NOTE — Progress Notes (Signed)
History of Present Illness:   He underwent ultrasound and biopsy of his prostate on 8.31.2021.  At that time, PSA 5.3, prostate volume 27 mL, PSA density 0.20.   2/12 cores revealed adenocarcinoma-left mid medial core GS 3+4 and 40% of core, left apex medial, GS 3+4 in 40% of sample.   2.11.2022: He underwent I-125 brachytherapy/SpaceOAR instillation   5.10.2022: He returns for his first visit after his brachytherapy.    9.6.2022: Here for routine followup. No issues with blood in his urine or stool.  Minimal urinary tract symptoms, nonbothersome. IPSS 11, quality-of-life score 2.  PSA 2.3.  Past Medical History:  Diagnosis Date   History of colitis 11/2019   ED visit in epic   Hyperlipidemia    Hypertension    followed by pcp   Prostate cancer [facility]) urologist--- dr Zidane Renner/  oncologist--- dr Tammi Klippel   dx 08/ 2021,  Stage T1c, Gleason 3+4   Wears glasses     Past Surgical History:  Procedure Laterality Date   COLONOSCOPY WITH PROPOFOL N/A 01/07/2021   Procedure: COLONOSCOPY WITH PROPOFOL;  Surgeon: Eloise Harman, DO;  [facility];  Service: Endoscopy;  Laterality: N/A;  11:45am   CYSTOSCOPY  06/29/2020   Procedure: CYSTOSCOPY FLEXIBLE;  Surgeon: Franchot Gallo, MD;  Location: [facility];  Service: Urology;;  no seeds found in bladder   INGUINAL HERNIA REPAIR Left 10-21-2002  '@[facility]'$    POLYPECTOMY  01/07/2021   Procedure: POLYPECTOMY;  Surgeon: Eloise Harman, DO;  [facility];  Service: Endoscopy;;   PROSTATE BIOPSY  08/ 2021 in urologist office   RADIOACTIVE SEED IMPLANT N/A 06/29/2020   Procedure: RADIOACTIVE SEED IMPLANT/BRACHYTHERAPY IMPLANT;  Surgeon: Franchot Gallo, MD;  Location: [facility];  Service: Urology;  Laterality: N/A;    68  seeds implanted   SPACE OAR INSTILLATION N/A 06/29/2020   Procedure: SPACE OAR INSTILLATION;  Surgeon: Franchot Gallo, MD;  [facility];  Service:  Urology;  Laterality: N/A;    Home Medications:  Allergies as of 01/22/2021   No Known Allergies      Medication List        Accurate as of January 21, 2021  8:24 PM. If you have any questions, ask your nurse or doctor.          amLODipine 5 MG tablet Commonly known as: NORVASC Take 5 mg by mouth every evening.   atorvastatin 10 MG tablet Commonly known as: LIPITOR Take 10 mg by mouth daily.   losartan-hydrochlorothiazide 100-25 MG tablet Commonly known as: HYZAAR Take 1 tablet by mouth in the morning.   naproxen sodium 220 MG tablet Commonly known as: ALEVE Take 220 mg by mouth daily as needed (pain/headache).        Allergies: No Known Allergies  Family History  Problem Relation Age of Onset   Hypertension Father    CAD Father    Cancer Father        type unknown   Prostate cancer Neg Hx    Colon cancer Neg Hx    Pancreatic cancer Neg Hx    Breast cancer Neg Hx     Social History:  reports that he quit smoking about 34 years ago. His smoking use included cigarettes. He has a 18.00 pack-year smoking history. He has never used smokeless tobacco. He reports that he does not currently use alcohol. He reports that he does not currently use drugs after having used the following drugs:  Marijuana.  ROS: A complete review of systems was performed.  All systems are negative except for pertinent findings as noted.  Physical Exam:  Vital signs in last 24 hours: There were no vitals taken for this visit. Constitutional:  Alert and oriented, No acute distress Cardiovascular: Regular rate  Respiratory: Normal respiratory effort GI: Abdomen is soft, nontender, nondistended, no abdominal masses.  No inguinal hernias Genitourinary: Normal uncircumcised  male phallus, testes are descended bilaterally and non-tender and without masses, scrotum is normal in appearance without lesions or masses, perineum is normal on inspection.  Normal anal sphincter tone.  Prostate  smooth, flat.  Mild superficial rectal mucosal irregularity, most likely consistent with prior SpaceOAR. Lymphatic: No lymphadenopathy Neurologic: Grossly intact, no focal deficits Psychiatric: Normal mood and affect  I have reviewed prior pt notes  I have reviewed urinalysis results  I have reviewed prior PSA and pathology results   Impression/Assessment:  History of prostate cancer, GG 2, status post I-125 brachytherapy approximately 7 months ago.  Excellent PSA response thus far without significant radiation related sequelae  Plan:  I will see him back in 6 months following PSA

## 2021-01-22 ENCOUNTER — Encounter: Payer: Self-pay | Admitting: Urology

## 2021-01-22 ENCOUNTER — Ambulatory Visit (INDEPENDENT_AMBULATORY_CARE_PROVIDER_SITE_OTHER): Payer: Medicare HMO | Admitting: Urology

## 2021-01-22 ENCOUNTER — Other Ambulatory Visit: Payer: Self-pay

## 2021-01-22 VITALS — BP 104/71 | HR 85

## 2021-01-22 DIAGNOSIS — C61 Malignant neoplasm of prostate: Secondary | ICD-10-CM

## 2021-01-22 LAB — URINALYSIS, ROUTINE W REFLEX MICROSCOPIC
Bilirubin, UA: NEGATIVE
Glucose, UA: NEGATIVE
Ketones, UA: NEGATIVE
Leukocytes,UA: NEGATIVE
Nitrite, UA: NEGATIVE
Protein,UA: NEGATIVE
Specific Gravity, UA: 1.02 (ref 1.005–1.030)
Urobilinogen, Ur: 0.2 mg/dL (ref 0.2–1.0)
pH, UA: 6 (ref 5.0–7.5)

## 2021-01-22 LAB — MICROSCOPIC EXAMINATION
Bacteria, UA: NONE SEEN
Epithelial Cells (non renal): NONE SEEN /hpf (ref 0–10)
Renal Epithel, UA: NONE SEEN /hpf
WBC, UA: NONE SEEN /hpf (ref 0–5)

## 2021-01-22 NOTE — Progress Notes (Signed)

## 2021-01-23 DIAGNOSIS — I10 Essential (primary) hypertension: Secondary | ICD-10-CM | POA: Diagnosis not present

## 2021-01-23 DIAGNOSIS — E785 Hyperlipidemia, unspecified: Secondary | ICD-10-CM | POA: Diagnosis not present

## 2021-01-25 DIAGNOSIS — E785 Hyperlipidemia, unspecified: Secondary | ICD-10-CM | POA: Diagnosis not present

## 2021-01-25 DIAGNOSIS — Z6829 Body mass index (BMI) 29.0-29.9, adult: Secondary | ICD-10-CM | POA: Diagnosis not present

## 2021-01-25 DIAGNOSIS — I10 Essential (primary) hypertension: Secondary | ICD-10-CM | POA: Diagnosis not present

## 2021-01-25 DIAGNOSIS — Z23 Encounter for immunization: Secondary | ICD-10-CM | POA: Diagnosis not present

## 2021-02-08 ENCOUNTER — Other Ambulatory Visit: Payer: Self-pay

## 2021-02-08 ENCOUNTER — Encounter (HOSPITAL_COMMUNITY): Payer: Self-pay

## 2021-02-08 ENCOUNTER — Observation Stay (HOSPITAL_COMMUNITY)
Admission: EM | Admit: 2021-02-08 | Discharge: 2021-02-09 | Disposition: A | Discharge status: Home / Self Care | Payer: No Typology Code available for payment source | Attending: Internal Medicine | Admitting: Internal Medicine

## 2021-02-08 DIAGNOSIS — Z79899 Other long term (current) drug therapy: Secondary | ICD-10-CM | POA: Diagnosis not present

## 2021-02-08 DIAGNOSIS — Z8546 Personal history of malignant neoplasm of prostate: Secondary | ICD-10-CM | POA: Diagnosis not present

## 2021-02-08 DIAGNOSIS — D62 Acute posthemorrhagic anemia: Secondary | ICD-10-CM | POA: Diagnosis not present

## 2021-02-08 DIAGNOSIS — Z20822 Contact with and (suspected) exposure to covid-19: Secondary | ICD-10-CM | POA: Diagnosis not present

## 2021-02-08 DIAGNOSIS — C61 Malignant neoplasm of prostate: Secondary | ICD-10-CM | POA: Diagnosis present

## 2021-02-08 DIAGNOSIS — Z87891 Personal history of nicotine dependence: Secondary | ICD-10-CM | POA: Insufficient documentation

## 2021-02-08 DIAGNOSIS — E876 Hypokalemia: Secondary | ICD-10-CM | POA: Insufficient documentation

## 2021-02-08 DIAGNOSIS — I159 Secondary hypertension, unspecified: Secondary | ICD-10-CM | POA: Diagnosis not present

## 2021-02-08 DIAGNOSIS — K625 Hemorrhage of anus and rectum: Secondary | ICD-10-CM

## 2021-02-08 DIAGNOSIS — K922 Gastrointestinal hemorrhage, unspecified: Principal | ICD-10-CM | POA: Diagnosis present

## 2021-02-08 DIAGNOSIS — E785 Hyperlipidemia, unspecified: Secondary | ICD-10-CM | POA: Diagnosis not present

## 2021-02-08 DIAGNOSIS — I10 Essential (primary) hypertension: Secondary | ICD-10-CM

## 2021-02-08 LAB — COMPREHENSIVE METABOLIC PANEL
ALT: 19 U/L (ref 0–44)
AST: 22 U/L (ref 15–41)
Albumin: 3.9 g/dL (ref 3.5–5.0)
Alkaline Phosphatase: 98 U/L (ref 38–126)
Anion gap: 7 (ref 5–15)
BUN: 22 mg/dL (ref 8–23)
CO2: 28 mmol/L (ref 22–32)
Calcium: 8.8 mg/dL — ABNORMAL LOW (ref 8.9–10.3)
Chloride: 99 mmol/L (ref 98–111)
Creatinine, Ser: 1.16 mg/dL (ref 0.61–1.24)
GFR, Estimated: >60 mL/min (ref >60)
Glucose, Bld: 95 mg/dL (ref 70–99)
Potassium: 3.2 mmol/L — ABNORMAL LOW (ref 3.5–5.1)
Sodium: 134 mmol/L — ABNORMAL LOW (ref 135–145)
Total Bilirubin: 0.3 mg/dL (ref 0.3–1.2)
Total Protein: 7.4 g/dL (ref 6.5–8.1)

## 2021-02-08 LAB — TYPE AND SCREEN
ABO/RH(D): O POS
Antibody Screen: NEGATIVE

## 2021-02-08 LAB — CBC
HCT: 35.3 % — ABNORMAL LOW (ref 39.0–52.0)
Hemoglobin: 11.9 g/dL — ABNORMAL LOW (ref 13.0–17.0)
MCH: 30 pg (ref 26.0–34.0)
MCHC: 33.7 g/dL (ref 30.0–36.0)
MCV: 88.9 fL (ref 80.0–100.0)
Platelets: 292 10*3/uL (ref 150–400)
RBC: 3.97 MIL/uL — ABNORMAL LOW (ref 4.22–5.81)
RDW: 13.5 % (ref 11.5–15.5)
WBC: 6.2 10*3/uL (ref 4.0–10.5)
nRBC: 0 % (ref 0.0–0.2)

## 2021-02-08 LAB — RESP PANEL BY RT-PCR (FLU A&B, COVID) ARPGX2
Influenza A by PCR: NEGATIVE
Influenza B by PCR: NEGATIVE
SARS Coronavirus 2 by RT PCR: NEGATIVE

## 2021-02-08 LAB — POC OCCULT BLOOD, ED: Fecal Occult Bld: POSITIVE — AB

## 2021-02-08 MED ORDER — AMLODIPINE BESYLATE 5 MG PO TABS
5.0000 mg | ORAL_TABLET | Freq: Every day | ORAL | Status: DC
Start: 2021-02-08 — End: 2021-02-09
  Administered 2021-02-08 – 2021-02-09 (×2): 5 mg via ORAL
  Filled 2021-02-08 (×2): qty 1

## 2021-02-08 MED ORDER — DM-GUAIFENESIN ER 30-600 MG PO TB12: 1.0000 | ORAL_TABLET | Freq: Two times a day (BID) | ORAL | Status: DC | PRN

## 2021-02-08 MED ORDER — TRAZODONE HCL 50 MG PO TABS: 50.0000 mg | ORAL_TABLET | Freq: Every evening | ORAL | Status: DC | PRN

## 2021-02-08 MED ORDER — POTASSIUM CHLORIDE CRYS ER 20 MEQ PO TBCR
40.0000 meq | EXTENDED_RELEASE_TABLET | Freq: Once | ORAL | Status: AC
  Administered 2021-02-08: 40 meq via ORAL
  Filled 2021-02-08: qty 2

## 2021-02-08 MED ORDER — ONDANSETRON HCL 4 MG PO TABS: 4.0000 mg | ORAL_TABLET | Freq: Four times a day (QID) | ORAL | Status: DC | PRN

## 2021-02-08 MED ORDER — ONDANSETRON HCL 4 MG/2ML IJ SOLN: 4.0000 mg | Freq: Four times a day (QID) | INTRAMUSCULAR | Status: DC | PRN

## 2021-02-08 MED ORDER — METOPROLOL TARTRATE 5 MG/5ML IV SOLN: 5.0000 mg | INTRAVENOUS | Status: DC | PRN

## 2021-02-08 MED ORDER — ATORVASTATIN CALCIUM 10 MG PO TABS
10.0000 mg | ORAL_TABLET | Freq: Every day | ORAL | Status: DC
  Administered 2021-02-09: 10 mg via ORAL
  Filled 2021-02-08: qty 1

## 2021-02-08 MED ORDER — HYDRALAZINE HCL 20 MG/ML IJ SOLN
10.0000 mg | INTRAMUSCULAR | Status: DC | PRN
Start: 2021-02-08 — End: 2021-02-09

## 2021-02-08 MED ORDER — IPRATROPIUM-ALBUTEROL 0.5-2.5 (3) MG/3ML IN SOLN: 3.0000 mL | RESPIRATORY_TRACT | Status: DC | PRN

## 2021-02-08 MED ORDER — SODIUM CHLORIDE 0.9 % IV SOLN: INTRAVENOUS | Status: DC

## 2021-02-08 MED ORDER — OXYCODONE HCL 5 MG PO TABS: 5.0000 mg | ORAL_TABLET | ORAL | Status: DC | PRN

## 2021-02-08 MED ORDER — SENNOSIDES-DOCUSATE SODIUM 8.6-50 MG PO TABS: 1.0000 | ORAL_TABLET | Freq: Every evening | ORAL | Status: DC | PRN

## 2021-02-08 MED ORDER — ACETAMINOPHEN 325 MG PO TABS
650.0000 mg | ORAL_TABLET | Freq: Four times a day (QID) | ORAL | Status: DC | PRN
  Administered 2021-02-08: 650 mg via ORAL
  Filled 2021-02-08: qty 2

## 2021-02-08 NOTE — H&P (Signed)
History and Physical    Kent Smith TGG:[id number] DOB: [birth year] DOA: 02/08/2021  PCP: Rosita Fire, MD Patient coming from: Home      Chief Complaint: Rectal Bleed  HPI: Kent Smith is a 70 y.o. male with medical history significant of hypertension, prostate cancer, hyperlipidemia comes to the hospital with complains of rectal bleeding.  Patient states he returned home this morning from work and when he was having a bowel movement he had a large episode of bloody bowel movement and had some on his toilet paper.  He called his PCP who asked him come to the hospital for further evaluation.  He denies any abdominal pain any other complaints.  Patient had colonoscopy outpatient about 3-4 weeks ago which showed polyps and nonbleeding hemorrhoids otherwise it was unremarkable.  In the ED patient was hemodynamically stable overall but his hemoglobin was noted to be 11.9 which was lower than baseline of 14.0.  GI team was consulted and medical team was requested to admit the patient.   Review of Systems: As per HPI otherwise 10 point review of systems negative.  Review of Systems Otherwise negative except as per HPI, including: General: Denies fever, chills, night sweats or unintended weight loss. Resp: Denies cough, wheezing, shortness of breath. Cardiac: Denies chest pain, palpitations, orthopnea, paroxysmal nocturnal dyspnea. GI: Denies abdominal pain, nausea, vomiting, diarrhea or constipation GU: Denies dysuria, frequency, hesitancy or incontinence MS: Denies muscle aches, joint pain or swelling Neuro: Denies headache, neurologic deficits (focal weakness, numbness, tingling), abnormal gait Psych: Denies anxiety, depression, SI/HI/AVH Skin: Denies new rashes or lesions ID: Denies sick contacts, exotic exposures, travel  Past Medical History:  Diagnosis Date   History of colitis 11/2019   ED visit in epic   Hyperlipidemia    Hypertension    followed by pcp   Prostate  cancer [facility]) urologist--- dr dahlstedt/  oncologist--- dr Tammi Klippel   dx 08/ 2021,  Stage T1c, Gleason 3+4   Wears glasses     Past Surgical History:  Procedure Laterality Date   COLONOSCOPY WITH PROPOFOL N/A 01/07/2021   Procedure: COLONOSCOPY WITH PROPOFOL;  Surgeon: Eloise Harman, DO;  [facility];  Service: Endoscopy;  Laterality: N/A;  11:45am   CYSTOSCOPY  06/29/2020   Procedure: CYSTOSCOPY FLEXIBLE;  Surgeon: Franchot Gallo, MD;  Location: [facility];  Service: Urology;;  no seeds found in bladder   INGUINAL HERNIA REPAIR Left 10-21-2002  @[facility]    POLYPECTOMY  01/07/2021   Procedure: POLYPECTOMY;  Surgeon: Eloise Harman, DO;  [facility];  Service: Endoscopy;;   PROSTATE BIOPSY  08/ 2021 in urologist office   RADIOACTIVE SEED IMPLANT N/A 06/29/2020   Procedure: RADIOACTIVE SEED IMPLANT/BRACHYTHERAPY IMPLANT;  Surgeon: Franchot Gallo, MD;  Location: [facility];  Service: Urology;  Laterality: N/A;    68  seeds implanted   SPACE OAR INSTILLATION N/A 06/29/2020   Procedure: SPACE OAR INSTILLATION;  Surgeon: Franchot Gallo, MD;  Location: [facility];  Service: Urology;  Laterality: N/A;    SOCIAL HISTORY:  reports that he quit smoking about 34 years ago. His smoking use included cigarettes. He has a 18.00 pack-year smoking history. He has never used smokeless tobacco. He reports that he does not currently use alcohol. He reports that he does not currently use drugs after having used the following drugs: Marijuana.  No Known Allergies  FAMILY HISTORY: Family History  Problem Relation Age of Onset   Hypertension Father  CAD Father    Cancer Father        type unknown   Prostate cancer Neg Hx    Colon cancer Neg Hx    Pancreatic cancer Neg Hx    Breast cancer Neg Hx      Prior to Admission medications   Medication Sig Start Date End Date Taking? Authorizing Provider  amLODipine (NORVASC) 5 MG  tablet Take 5 mg by mouth daily. 09/09/19  Yes [provider]  atorvastatin (LIPITOR) 10 MG tablet Take 10 mg by mouth daily. 10/28/19  Yes [provider]  losartan-hydrochlorothiazide (HYZAAR) 100-25 MG tablet Take 1 tablet by mouth in the morning.   Yes [provider]  naproxen sodium (ALEVE) 220 MG tablet Take 220 mg by mouth daily as needed (pain/headache).   Yes [provider]    Physical Exam: Vitals:   02/08/21 1300 02/08/21 1330 02/08/21 1400 02/08/21 1430  BP: 121/76 135/72 131/77 124/77  Pulse: 68 79 72 71  Resp:    16  Temp:      TempSrc:      SpO2: 100% 100% 100% 99%      Constitutional: NAD, calm, comfortable Eyes: PERRL, lids and conjunctivae normal ENMT: Mucous membranes are moist. Posterior pharynx clear of any exudate or lesions.Normal dentition.  Neck: normal, supple, no masses, no thyromegaly Respiratory: clear to auscultation bilaterally, no wheezing, no crackles. Normal respiratory effort. No accessory muscle use.  Cardiovascular: Regular rate and rhythm, no murmurs / rubs / gallops. No extremity edema. 2+ pedal pulses. No carotid bruits.  Abdomen: no tenderness, no masses palpated. No hepatosplenomegaly. Bowel sounds positive.  Musculoskeletal: no clubbing / cyanosis. No joint deformity upper and lower extremities. Good ROM, no contractures. Normal muscle tone.  Skin: no rashes, lesions, ulcers. No induration Neurologic: CN 2-12 grossly intact. Sensation intact, DTR normal. Strength 5/5 in all 4.  Psychiatric: Normal judgment and insight. Alert and oriented x 3. Normal mood.     Labs on Admission: I have personally reviewed following labs and imaging studies  CBC: Recent Labs  Lab 02/08/21 1246  WBC 6.2  HGB 11.9*  HCT 35.3*  MCV 88.9  PLT 191   Basic Metabolic Panel: Recent Labs  Lab 02/08/21 1246  NA 134*  K 3.2*  CL 99  CO2 28  GLUCOSE 95  BUN 22  CREATININE 1.16  CALCIUM 8.8*   GFR: CrCl cannot  be calculated (Unknown ideal weight.). Liver Function Tests: Recent Labs  Lab 02/08/21 1246  AST 22  ALT 19  ALKPHOS 98  BILITOT 0.3  PROT 7.4  ALBUMIN 3.9   No results for input(s): LIPASE, AMYLASE in the last 168 hours. No results for input(s): AMMONIA in the last 168 hours. Coagulation Profile: No results for input(s): INR, PROTIME in the last 168 hours. Cardiac Enzymes: No results for input(s): CKTOTAL, CKMB, CKMBINDEX, TROPONINI in the last 168 hours. BNP (last 3 results) No results for input(s): PROBNP in the last 8760 hours. HbA1C: No results for input(s): HGBA1C in the last 72 hours. CBG: No results for input(s): GLUCAP in the last 168 hours. Lipid Profile: No results for input(s): CHOL, HDL, LDLCALC, TRIG, CHOLHDL, LDLDIRECT in the last 72 hours. Thyroid Function Tests: No results for input(s): TSH, T4TOTAL, FREET4, T3FREE, THYROIDAB in the last 72 hours. Anemia Panel: No results for input(s): VITAMINB12, FOLATE, FERRITIN, TIBC, IRON, RETICCTPCT in the last 72 hours. Urine analysis:    Component Value Date/Time   APPEARANCEUR Clear 01/22/2021 1033  GLUCOSEU Negative 01/22/2021 1033   BILIRUBINUR Negative 01/22/2021 1033   PROTEINUR Negative 01/22/2021 1033   NITRITE Negative 01/22/2021 1033   LEUKOCYTESUR Negative 01/22/2021 1033   Sepsis Labs: !!!!!!!!!!!!!!!!!!!!!!!!!!!!!!!!!!!!!!!!!!!! @LABRCNTIP (procalcitonin:4,lacticidven:4) )No results found for this or any previous visit (from the past 240 hour(s)).   Radiological Exams on Admission: No results found.   All images have been reviewed by me personally.    Assessment/Plan Active Problems:   Malignant neoplasm of prostate (HCC)   Rectal bleed   HTN (hypertension)   HLD (hyperlipidemia)   GI bleed    Rectal bleed Acute blood loss anemia -Admit patient for observation.  Diverticular versus hemorrhoid.  Recently had colonoscopy about 3 weeks ago which showed polyps and internal hemorrhoids.   Polyps were removed.  Baseline hemoglobin 14.0, admission hemoglobin 11.9.  We will put patient on clear liquid diet today, n.p.o. past midnight just as precaution.  GI consulted by ED.  Hopefully this was just 1 episode of bleeding and will not require any further inpatient work-up.  We will monitor him for 24 hours in the hospital.  Hypokalemia - Repletion ordered  Essential hypertension - Continue Norvasc, hold losartan/HCTZ - IV hydralazine and Lopressor as needed  Hyperlipidemia - Lipitor  History of prostate cancer - Follow-up outpatient    DVT prophylaxis: SCDs Code Status: Full code Family Communication: None at bedside Consults called: Gastroenterology consulted by ED Admission status: Observation admit  Status is: Observation  The patient will require care spanning > 2 midnights and should be moved to inpatient because: Inpatient level of care appropriate due to severity of illness  Dispo: The patient is from: Home              Anticipated d/c is to: Home              Patient currently is not medically stable to d/c.   Difficult to place patient No       Time Spent: 65 minutes.  >50% of the time was devoted to discussing the patients care, assessment, plan and disposition with other care givers along with counseling the patient about the risks and benefits of treatment.    Rosell Khouri Arsenio Loader MD [facility]  If 7PM-7AM, please contact night-coverage   02/08/2021, 3:07 PM

## 2021-02-08 NOTE — ED Triage Notes (Signed)
Pt presents to ED with complaints of rectal bleeding, dark blood stool happened today.

## 2021-02-08 NOTE — Consult Note (Signed)
Referring Provider: No ref. provider found Primary Care Physician:  Rosita Fire, MD Primary Gastroenterologist:  Dr.Carver  Reason for Consultation:   rectal bleeding  HPI: Pleasant 70 year old gentleman comes to the hospital day with dark maroon stools per rectum since yesterday.  In the ED, he was evaluated by Dr. Rogene Houston who found the patient to be hemodynamically stable and found a generous amount of dark red blood in the rectal vault/strongly Hemoccult positive.  Hemoglobin hematocrit today 11.9/35.37 months ago 14.6/43.8. Patient just had a colonoscopy by Dr. Abbey Chatters on 8/22 due to positive occult blood test.  Patient was found to have 3 small adenomas removed and internal and external hemorrhoids.  Patient's past medical history is also significant for history of prostate cancer for which she underwent radiation overactive seed implants in February of this year.  Patient tells me he does take Aleve on a regular basis for various aches and pains.  His BUN and creatinine are normal.  Past Medical History:  Diagnosis Date   History of colitis 11/2019   ED visit in epic   Hyperlipidemia    Hypertension    followed by pcp   Prostate cancer [facility]) urologist--- dr dahlstedt/  oncologist--- dr Tammi Klippel   dx 08/ 2021,  Stage T1c, Gleason 3+4   Wears glasses     Past Surgical History:  Procedure Laterality Date   COLONOSCOPY WITH PROPOFOL N/A 01/07/2021   Procedure: COLONOSCOPY WITH PROPOFOL;  Surgeon: Eloise Harman, DO;  [facility];  Service: Endoscopy;  Laterality: N/A;  11:45am   CYSTOSCOPY  06/29/2020   Procedure: CYSTOSCOPY FLEXIBLE;  Surgeon: Franchot Gallo, MD;  Location: [facility];  Service: Urology;;  no seeds found in bladder   INGUINAL HERNIA REPAIR Left 10-21-2002  @[facility]    POLYPECTOMY  01/07/2021   Procedure: POLYPECTOMY;  Surgeon: Eloise Harman, DO;  [facility];  Service: Endoscopy;;   PROSTATE BIOPSY  08/ 2021  in urologist office   RADIOACTIVE SEED IMPLANT N/A 06/29/2020   Procedure: RADIOACTIVE SEED IMPLANT/BRACHYTHERAPY IMPLANT;  Surgeon: Franchot Gallo, MD;  Location: [facility];  Service: Urology;  Laterality: N/A;    68  seeds implanted   SPACE OAR INSTILLATION N/A 06/29/2020   Procedure: SPACE OAR INSTILLATION;  Surgeon: Franchot Gallo, MD;  Location: [facility];  Service: Urology;  Laterality: N/A;    Prior to Admission medications   Medication Sig Start Date End Date Taking? Authorizing Provider  amLODipine (NORVASC) 5 MG tablet Take 5 mg by mouth daily. 09/09/19  Yes [provider]  atorvastatin (LIPITOR) 10 MG tablet Take 10 mg by mouth daily. 10/28/19  Yes [provider]  losartan-hydrochlorothiazide (HYZAAR) 100-25 MG tablet Take 1 tablet by mouth in the morning.   Yes [provider]  naproxen sodium (ALEVE) 220 MG tablet Take 220 mg by mouth daily as needed (pain/headache).   Yes [provider]    Current Facility-Administered Medications  Medication Dose Route Frequency Provider Last Rate Last Admin   0.9 %  sodium chloride infusion   Intravenous Continuous Amin, Ankit Chirag, MD       acetaminophen (TYLENOL) tablet 650 mg  650 mg Oral Q6H PRN Amin, Ankit Chirag, MD       amLODipine (NORVASC) tablet 5 mg  5 mg Oral Daily Amin, Ankit Chirag, MD   5 mg at 02/08/21 1526   [START ON 02/09/2021] atorvastatin (LIPITOR) tablet 10 mg  10 mg Oral Daily  Amin, Ankit Chirag, MD       dextromethorphan-guaiFENesin ([place] DM) 30-600 MG per 12 hr tablet 1 tablet  1 tablet Oral BID PRN Amin, Ankit Chirag, MD       hydrALAZINE (APRESOLINE) injection 10 mg  10 mg Intravenous Q4H PRN Amin, Ankit Chirag, MD       ipratropium-albuterol (DUONEB) 0.5-2.5 (3) MG/3ML nebulizer solution 3 mL  3 mL Nebulization Q4H PRN Amin, Ankit Chirag, MD       metoprolol tartrate (LOPRESSOR) injection 5 mg  5 mg Intravenous Q4H PRN Amin, Ankit  Chirag, MD       ondansetron (ZOFRAN) tablet 4 mg  4 mg Oral Q6H PRN Amin, Ankit Chirag, MD       Or   ondansetron (ZOFRAN) injection 4 mg  4 mg Intravenous Q6H PRN Amin, Ankit Chirag, MD       oxyCODONE (Oxy IR/ROXICODONE) immediate release tablet 5 mg  5 mg Oral Q4H PRN Amin, Ankit Chirag, MD       senna-docusate (Senokot-S) tablet 1 tablet  1 tablet Oral QHS PRN Amin, Ankit Chirag, MD       traZODone (DESYREL) tablet 50 mg  50 mg Oral QHS PRN Amin, Jeanella Flattery, MD       Current Outpatient Medications  Medication Sig Dispense Refill   amLODipine (NORVASC) 5 MG tablet Take 5 mg by mouth daily.     atorvastatin (LIPITOR) 10 MG tablet Take 10 mg by mouth daily.     losartan-hydrochlorothiazide (HYZAAR) 100-25 MG tablet Take 1 tablet by mouth in the morning.     naproxen sodium (ALEVE) 220 MG tablet Take 220 mg by mouth daily as needed (pain/headache).      Allergies as of 02/08/2021   (No Known Allergies)    Family History  Problem Relation Age of Onset   Hypertension Father    CAD Father    Cancer Father        type unknown   Prostate cancer Neg Hx    Colon cancer Neg Hx    Pancreatic cancer Neg Hx    Breast cancer Neg Hx     Social History   Socioeconomic History   Marital status: Widowed    Spouse name: Not on file   Number of children: Not on file   Years of education: Not on file   Highest education level: Not on file  Occupational History    Comment: continues to work full time  Tobacco Use   Smoking status: Former    Packs/day: 1.00    Years: 18.00    Pack years: 18.00    Types: Cigarettes    Quit date: 05/19/1986    Years since quitting: 34.7   Smokeless tobacco: Never  Vaping Use   Vaping Use: Never used  Substance and Sexual Activity   Alcohol use: Not Currently    Comment: 1 beer every now and then   Drug use: Not Currently    Types: Marijuana    Comment: 06-26-2020  per pt once in a awhile,  last smoked "week"  appor.x 6 months ago   Sexual  activity: Not Currently  Other Topics Concern   Not on file  Social History Narrative   Not on file   Social Determinants of Health   Financial Resource Strain: Not on file  Food Insecurity: No Food Insecurity   Worried About Running Out of Food in the Last Year: Never true   Ran Out of Food in the Last  Year: Never true  Transportation Needs: No Transportation Needs   Lack of Transportation (Medical): No   Lack of Transportation (Non-Medical): No  Physical Activity: Not on file  Stress: Not on file  Social Connections: Not on file  Intimate Partner Violence: Not on file    Review of Systems:  As in history of present illness  Physical Exam: Vital signs in last 24 hours: Temp:  [98.6 F (37 C)] 98.6 F (37 C) (09/23 1217) Pulse Rate:  [68-79] 71 (09/23 1430) Resp:  [16] 16 (09/23 1430) BP: (121-137)/(72-84) 124/77 (09/23 1430) SpO2:  [99 %-100 %] 99 % (09/23 1430)   General:   Alert,  Well-developed, well-nourished, pleasant and cooperative in NAD Lungs:  Clear throughout to auscultation.   No wheezes, crackles, or rhonchi. No acute distress. Heart:  Regular rate and rhythm; no murmurs, clicks, rubs,  or gallops. Abdomen:  Soft, nontender and nondistended. No masses, hepatosplenomegaly or hernias noted. Normal bowel sounds, without guarding, and without rebound.   Rectal: Deferred; already performed by EDP as outlined above No intake/output data recorded. Intake/Output this shift: No intake/output data recorded.  Lab Results: Recent Labs    02/08/21 1246  WBC 6.2  HGB 11.9*  HCT 35.3*  PLT 292   BMET Recent Labs    02/08/21 1246  NA 134*  K 3.2*  CL 99  CO2 28  GLUCOSE 95  BUN 22  CREATININE 1.16  CALCIUM 8.8*   LFT Recent Labs    02/08/21 1246  PROT 7.4  ALBUMIN 3.9  AST 22  ALT 19  ALKPHOS 3  BILITOT 0.3   Impression:  70 year old gentleman admitted to the hospital with GI bleeding characterized by dark maroon stool in the rectal vault.   He has had a diminution in his hemoglobin from baseline 7 months ago.  He remains hemodynamically stable.  Colonoscopy with polypectomy about 1 month ago.  This would be unusual for post polypectomy bleed but is certainly within the realm of possibility.  He does take NSAIDs on a regular basis  It is also notable that he could have radiation-induced proctitis from seed implants previously but no abnormalities in the rectum were noted at the time of recent colonoscopy.  I certainly do not feel this is a rapid transit upper GI bleed.  BUN and creatinine are normal.  Recommendations:  Observation.  Clear liquid diet.  Trend H&H.  Would be prudent for patient to stop taking Aleve for now.  We will reassess tomorrow morning.     Notice:  This dictation was prepared with Dragon dictation along with smaller phrase technology. Any transcriptional errors that result from this process are unintentional and may not be corrected upon review.

## 2021-02-08 NOTE — Plan of Care (Signed)

## 2021-02-08 NOTE — Progress Notes (Signed)
Patient arrived to unit with stable vitals & is alert/oriented. He is without complaints of nausea/pain. Oriented to room. Call bell within reach. No needs expressed at this time.

## 2021-02-08 NOTE — ED Provider Notes (Signed)
[facility] Provider Note   CSN: [id number] Arrival date & time: 02/08/21  1159     History Chief Complaint  Patient presents with   Rectal Bleeding    Kent Smith is a 70 y.o. male.  Patient today about 1100 with a large bowel movement with dark red blood.  Patient without any abdominal pain nausea or vomiting.  He has a history of internal hemorrhoids and is used to seeing some red blood.  Patient is followed by Dr. Abbey Chatters.  Patient had colonoscopy done on August 22 by Dr. Abbey Chatters had some polyps that were biopsied.      Past Medical History:  Diagnosis Date   History of colitis 11/2019   ED visit in epic   Hyperlipidemia    Hypertension    followed by pcp   Prostate cancer [facility]) urologist--- dr dahlstedt/  oncologist--- dr Tammi Klippel   dx 08/ 2021,  Stage T1c, Gleason 3+4   Wears glasses     Patient Active Problem List   Diagnosis Date Noted   Rectal bleed 02/08/2021   HTN (hypertension) 02/08/2021   HLD (hyperlipidemia) 02/08/2021   GI bleed 02/08/2021   Malignant neoplasm of prostate ([place]) 04/20/2020   Positive FIT (fecal immunochemical test) 02/01/2020    Past Surgical History:  Procedure Laterality Date   COLONOSCOPY WITH PROPOFOL N/A 01/07/2021   Procedure: COLONOSCOPY WITH PROPOFOL;  Surgeon: Eloise Harman, DO;  Location: [facility];  Service: Endoscopy;  Laterality: N/A;  11:45am   CYSTOSCOPY  06/29/2020   Procedure: CYSTOSCOPY FLEXIBLE;  Surgeon: Franchot Gallo, MD;  Location: [facility];  Service: Urology;;  no seeds found in bladder   INGUINAL HERNIA REPAIR Left 10-21-2002  @[facility]    POLYPECTOMY  01/07/2021   Procedure: POLYPECTOMY;  Surgeon: Eloise Harman, DO;  Location: [facility];  Service: Endoscopy;;   PROSTATE BIOPSY  08/ 2021 in urologist office   RADIOACTIVE SEED IMPLANT N/A 06/29/2020   Procedure: RADIOACTIVE SEED IMPLANT/BRACHYTHERAPY IMPLANT;  Surgeon: Franchot Gallo, MD;  Location:  [facility];  Service: Urology;  Laterality: N/A;    67  seeds implanted   SPACE OAR INSTILLATION N/A 06/29/2020   Procedure: SPACE OAR INSTILLATION;  Surgeon: Franchot Gallo, MD;  Location: [facility];  Service: Urology;  Laterality: N/A;       Family History  Problem Relation Age of Onset   Hypertension Father    CAD Father    Cancer Father        type unknown   Prostate cancer Neg Hx    Colon cancer Neg Hx    Pancreatic cancer Neg Hx    Breast cancer Neg Hx     Social History   Tobacco Use   Smoking status: Former    Packs/day: 1.00    Years: 18.00    Pack years: 18.00    Types: Cigarettes    Quit date: 05/19/1986    Years since quitting: 34.7   Smokeless tobacco: Never  Vaping Use   Vaping Use: Never used  Substance Use Topics   Alcohol use: Not Currently    Comment: 1 beer every now and then   Drug use: Not Currently    Types: Marijuana    Comment: 06-26-2020  per pt once in a awhile,  last smoked "week"  appor.x 6 months ago    Home Medications Prior to Admission medications   Medication Sig Start Date End Date Taking? Authorizing Provider  amLODipine (NORVASC) 5 MG tablet Take 5 mg by mouth daily. 09/09/19  Yes [provider]  atorvastatin (LIPITOR) 10 MG tablet Take 10 mg by mouth daily. 10/28/19  Yes [provider]  losartan-hydrochlorothiazide (HYZAAR) 100-25 MG tablet Take 1 tablet by mouth in the morning.   Yes [provider]  naproxen sodium (ALEVE) 220 MG tablet Take 220 mg by mouth daily as needed (pain/headache).   Yes [provider]    Allergies    Patient has no known allergies.  Review of Systems   Review of Systems  Constitutional:  Negative for chills and fever.  HENT:  Negative for ear pain and sore throat.   Eyes:  Negative for pain and visual disturbance.  Respiratory:  Negative for cough and shortness of breath.   Cardiovascular:  Negative for chest pain and  palpitations.  Gastrointestinal:  Positive for blood in stool. Negative for abdominal pain and vomiting.  Genitourinary:  Negative for dysuria and hematuria.  Musculoskeletal:  Negative for arthralgias and back pain.  Skin:  Negative for color change and rash.  Neurological:  Negative for seizures and syncope.  All other systems reviewed and are negative.  Physical Exam Updated Vital Signs BP 139/90   Pulse 71   Temp 97.9 F (36.6 C) (Oral)   Resp 14   SpO2 100%   Physical Exam Vitals and nursing note reviewed.  Constitutional:      Appearance: Normal appearance. He is well-developed.  HENT:     Head: Normocephalic and atraumatic.  Eyes:     Extraocular Movements: Extraocular movements intact.     Conjunctiva/sclera: Conjunctivae normal.     Pupils: Pupils are equal, round, and reactive to light.  Cardiovascular:     Rate and Rhythm: Normal rate and regular rhythm.     Heart sounds: No murmur heard. Pulmonary:     Effort: Pulmonary effort is normal. No respiratory distress.     Breath sounds: Normal breath sounds.  Abdominal:     Palpations: Abdomen is soft.     Tenderness: There is no abdominal tenderness.  Genitourinary:    Rectum: Guaiac result positive.     Comments: Anal and rectal exam anal without any prolapsed internal hemorrhoids no external hemorrhoids.  Large amount of dark red blood.  Not consistent with hemorrhoidal bleeding. Musculoskeletal:        General: Normal range of motion.     Cervical back: Normal range of motion and neck supple.  Skin:    General: Skin is warm and dry.     Capillary Refill: Capillary refill takes less than 2 seconds.  Neurological:     General: No focal deficit present.     Mental Status: He is alert and oriented to person, place, and time.     Cranial Nerves: No cranial nerve deficit.     Sensory: No sensory deficit.     Motor: No weakness.    ED Results / Procedures / Treatments   Labs (all labs ordered are listed, but  only abnormal results are displayed) Labs Reviewed  COMPREHENSIVE METABOLIC PANEL - Abnormal; Notable for the following components:      Result Value   Sodium 134 (*)    Potassium 3.2 (*)    Calcium 8.8 (*)    All other components within normal limits  CBC - Abnormal; Notable for the following components:   RBC 3.97 (*)    Hemoglobin 11.9 (*)    HCT 35.3 (*)  All other components within normal limits  POC OCCULT BLOOD, ED - Abnormal; Notable for the following components:   Fecal Occult Bld POSITIVE (*)    All other components within normal limits  RESP PANEL BY RT-PCR (FLU A&B, COVID) ARPGX2  HIV ANTIBODY (ROUTINE TESTING W REFLEX)  POC OCCULT BLOOD, ED  TYPE AND SCREEN    EKG None  Radiology No results found.  Procedures Procedures   Medications Ordered in ED Medications  amLODipine (NORVASC) tablet 5 mg (5 mg Oral Given 02/08/21 1526)  atorvastatin (LIPITOR) tablet 10 mg (has no administration in time range)  0.9 %  sodium chloride infusion ( Intravenous Infusion Verify 02/08/21 1552)  ondansetron (ZOFRAN) tablet 4 mg (has no administration in time range)    Or  ondansetron (ZOFRAN) injection 4 mg (has no administration in time range)  ipratropium-albuterol (DUONEB) 0.5-2.5 (3) MG/3ML nebulizer solution 3 mL (has no administration in time range)  hydrALAZINE (APRESOLINE) injection 10 mg (has no administration in time range)  metoprolol tartrate (LOPRESSOR) injection 5 mg (has no administration in time range)  dextromethorphan-guaiFENesin (MUCINEX DM) 30-600 MG per 12 hr tablet 1 tablet (has no administration in time range)  senna-docusate (Senokot-S) tablet 1 tablet (has no administration in time range)  traZODone (DESYREL) tablet 50 mg (has no administration in time range)  acetaminophen (TYLENOL) tablet 650 mg (has no administration in time range)  oxyCODONE (Oxy IR/ROXICODONE) immediate release tablet 5 mg (has no administration in time range)  potassium chloride  SA (KLOR-CON) CR tablet 40 mEq (40 mEq Oral Given 02/08/21 1526)    ED Course  I have reviewed the triage vital signs and the nursing notes.  Pertinent labs & imaging results that were available during my care of the patient were reviewed by me and considered in my medical decision making (see chart for details).    MDM Rules/Calculators/A&P                           CRITICAL CARE Performed by: Fredia Sorrow Total critical care time: 35 minutes Critical care time was exclusive of separately billable procedures and treating other patients. Critical care was necessary to treat or prevent imminent or life-threatening deterioration. Critical care was time spent personally by me on the following activities: development of treatment plan with patient and/or surrogate as well as nursing, discussions with consultants, evaluation of patient's response to treatment, examination of patient, obtaining history from patient or surrogate, ordering and performing treatments and interventions, ordering and review of laboratory studies, ordering and review of radiographic studies, pulse oximetry and re-evaluation of patient's condition.   Patient hemodynamically stable.  Hemoglobin is stable.  Electrolytes without significant abnormalities other than mild hypokalemia with a potassium of 3.2.  COVID testing negative.  Discussed with Dr. Gala Romney from gastroenterology.  Agrees with patient being admitted by hospitalist and observation to make sure her blood counts do not drop.  Does not need blood transfusion at this time.  And as stated is hemodynamically stable.  But since had biopsies of those polyps possibly could have some bleeding from that site.  Hospitalist will admit   Final Clinical Impression(s) / ED Diagnoses Final diagnoses:  Acute GI bleeding    Rx / DC Orders ED Discharge Orders     None        Fredia Sorrow, MD 02/08/21 1623

## 2021-02-09 DIAGNOSIS — K625 Hemorrhage of anus and rectum: Secondary | ICD-10-CM | POA: Diagnosis not present

## 2021-02-09 DIAGNOSIS — C61 Malignant neoplasm of prostate: Secondary | ICD-10-CM

## 2021-02-09 DIAGNOSIS — K922 Gastrointestinal hemorrhage, unspecified: Secondary | ICD-10-CM | POA: Diagnosis not present

## 2021-02-09 DIAGNOSIS — I10 Essential (primary) hypertension: Secondary | ICD-10-CM | POA: Diagnosis not present

## 2021-02-09 DIAGNOSIS — E782 Mixed hyperlipidemia: Secondary | ICD-10-CM | POA: Diagnosis not present

## 2021-02-09 LAB — COMPREHENSIVE METABOLIC PANEL
ALT: 16 U/L (ref 0–44)
AST: 19 U/L (ref 15–41)
Albumin: 3.7 g/dL (ref 3.5–5.0)
Alkaline Phosphatase: 60 U/L (ref 38–126)
Anion gap: 5 (ref 5–15)
BUN: 15 mg/dL (ref 8–23)
CO2: 27 mmol/L (ref 22–32)
Calcium: 9.1 mg/dL (ref 8.9–10.3)
Chloride: 104 mmol/L (ref 98–111)
Creatinine, Ser: 0.96 mg/dL (ref 0.61–1.24)
GFR, Estimated: >60 mL/min (ref >60)
Glucose, Bld: 83 mg/dL (ref 70–99)
Potassium: 4.4 mmol/L (ref 3.5–5.1)
Sodium: 136 mmol/L (ref 135–145)
Total Bilirubin: 0.7 mg/dL (ref 0.3–1.2)
Total Protein: 7.1 g/dL (ref 6.5–8.1)

## 2021-02-09 LAB — CBC
HCT: 38.2 % — ABNORMAL LOW (ref 39.0–52.0)
Hemoglobin: 12.8 g/dL — ABNORMAL LOW (ref 13.0–17.0)
MCH: 30.3 pg (ref 26.0–34.0)
MCHC: 33.5 g/dL (ref 30.0–36.0)
MCV: 90.5 fL (ref 80.0–100.0)
Platelets: 297 10*3/uL (ref 150–400)
RBC: 4.22 MIL/uL (ref 4.22–5.81)
RDW: 14 % (ref 11.5–15.5)
WBC: 6.5 10*3/uL (ref 4.0–10.5)
nRBC: 0 % (ref 0.0–0.2)

## 2021-02-09 LAB — MAGNESIUM: Magnesium: 2.1 mg/dL (ref 1.7–2.4)

## 2021-02-09 LAB — PROTIME-INR
INR: 1.1 (ref 0.8–1.2)
Prothrombin Time: 13.7 seconds (ref 11.4–15.2)

## 2021-02-09 LAB — HIV ANTIBODY (ROUTINE TESTING W REFLEX): HIV Screen 4th Generation wRfx: NONREACTIVE

## 2021-02-09 LAB — APTT: aPTT: 30 seconds (ref 24–36)

## 2021-02-09 MED ORDER — HYDROCORTISONE (PERIANAL) 2.5 % EX CREA: TOPICAL_CREAM | Freq: Three times a day (TID) | CUTANEOUS | 0 refills | Status: DC

## 2021-02-09 MED ORDER — HYDROCORTISONE (PERIANAL) 2.5 % EX CREA
TOPICAL_CREAM | Freq: Three times a day (TID) | CUTANEOUS | Status: DC
  Filled 2021-02-09: qty 28.35

## 2021-02-09 MED ORDER — HYDROCORTISONE (PERIANAL) 2.5 % EX CREA: TOPICAL_CREAM | Freq: Three times a day (TID) | CUTANEOUS | 0 refills | Status: AC

## 2021-02-09 NOTE — Clinical Social Work Note (Signed)
Williams notification done [phone]

## 2021-02-09 NOTE — Discharge Summary (Signed)
Physician Discharge Summary  Kent Smith QIH:[id number] DOB: 04/18/1951 DOA: 02/08/2021  PCP: Rosita Fire, MD  Admit date: 02/08/2021 Discharge date: 02/09/2021  Admitted From: home Disposition:  Home   Recommendations for Outpatient Follow-up:  Follow up with PCP in 1-2 weeks Please obtain BMP/CBC in one week    Discharge Condition: Stable CODE STATUS:FULL Diet recommendation: Heart Healthy / Carb Modified / Dysphagia / Regular   Brief/Interim Summary: 70 y.o. male with medical history significant of hypertension, prostate cancer, hyperlipidemia comes to the hospital with complains of rectal bleeding.  Patient states he returned home this morning from work and when he was having a bowel movement he had a large episode of bloody bowel movement and had some on his toilet paper.  He called his PCP who asked him come to the hospital for further evaluation.  He denies any abdominal pain any other complaints.   Patient had colonoscopy outpatient on 01/07/21 which showed polyps (adenomas) and nonbleeding hemorrhoids; otherwise it was unremarkable.  In the ED patient was hemodynamically stable overall but his hemoglobin was noted to be 11.9 which was lower than baseline of 14.0.  GI team was consulted and medical team was requested to admit the patient.  Discharge Diagnoses:  Hematochezia/Acute Blood Loss Anemia -presented with Hgb 11.9 -baseline Hgb 13-14 -GI consult appreciated -Hgb remained stable>>12.8 on day of dc -diet advanced which pt tolerated -hematochezia decreased and ultimately resolved on day of d/c -GI suspects anorectal source -Rx anusol cream TID x 2 weeks -GI cleared patient for d/c on 9/24  Hypertension -continue amlodipine -hold losartan/HCTZ -BP remains well controlled  Hypokalemia -replete  Hyperlipidemia - Lipitor  History of prostate cancer - Follow-up outpatient with urology  Discharge Instructions   Allergies as of 02/09/2021   No  Known Allergies      Medication List     STOP taking these medications    losartan-hydrochlorothiazide 100-25 MG tablet Commonly known as: HYZAAR   naproxen sodium 220 MG tablet Commonly known as: ALEVE       TAKE these medications    amLODipine 5 MG tablet Commonly known as: NORVASC Take 5 mg by mouth daily.   atorvastatin 10 MG tablet Commonly known as: LIPITOR Take 10 mg by mouth daily.        No Known Allergies  Consultations: GI   Procedures/Studies: No results found.      Discharge Exam: Vitals:   02/09/21 0219 02/09/21 0608  BP: 111/76 125/79  Pulse: 61 67  Resp: 18 20  Temp: (!) 97.4 F (36.3 C) 97.6 F (36.4 C)  SpO2: 100% 100%   Vitals:   02/08/21 2209 02/09/21 0114 02/09/21 0219 02/09/21 0608  BP: 136/79 127/78 111/76 125/79  Pulse: 67 63 61 67  Resp: 17 19 18 20   Temp: 98 F (36.7 C) 97.9 F (36.6 C) (!) 97.4 F (36.3 C) 97.6 F (36.4 C)  TempSrc: Oral Oral Oral Oral  SpO2: 99% 100% 100% 100%  Weight:      Height:        General: Pt is alert, awake, not in acute distress Cardiovascular: RRR, S1/S2 +, no rubs, no gallops Respiratory: CTA bilaterally, no wheezing, no rhonchi Abdominal: Soft, NT, ND, bowel sounds + Extremities: no edema, no cyanosis   The results of significant diagnostics from this hospitalization (including imaging, microbiology, ancillary and laboratory) are listed below for reference.    Significant Diagnostic Studies: No results found.  Microbiology: Recent Results (from the past 240 hour(s))  Resp Panel by RT-PCR (Flu A&B, Covid) Nasopharyngeal Swab     Status: None   Collection Time: 02/08/21  2:16 PM   Specimen: Nasopharyngeal Swab; Nasopharyngeal(NP) swabs in vial transport medium  Result Value Ref Range Status   SARS Coronavirus 2 by RT PCR NEGATIVE NEGATIVE Final    Comment: (NOTE) SARS-CoV-2 target nucleic acids are NOT DETECTED.  The SARS-CoV-2 RNA is generally detectable in upper  respiratory specimens during the acute phase of infection. The lowest concentration of SARS-CoV-2 viral copies this assay can detect is 138 copies/mL. A negative result does not preclude SARS-Cov-2 infection and should not be used as the sole basis for treatment or other patient management decisions. A negative result may occur with  improper specimen collection/handling, submission of specimen other than nasopharyngeal swab, presence of viral mutation(s) within the areas targeted by this assay, and inadequate number of viral copies(<138 copies/mL). A negative result must be combined with clinical observations, patient history, and epidemiological information. The expected result is Negative.  Fact Sheet for Patients:  EntrepreneurPulse.com.au  Fact Sheet for Healthcare Providers:  IncredibleEmployment.be  This test is no t yet approved or cleared by the [place] FDA and  has been authorized for detection and/or diagnosis of SARS-CoV-2 by FDA under an Emergency Use Authorization (EUA). This EUA will remain  in effect (meaning this test can be used) for the duration of the COVID-19 declaration under Section 564(b)(1) of the Act, 21 U.S.C.section 360bbb-3(b)(1), unless the authorization is terminated  or revoked sooner.       Influenza A by PCR NEGATIVE NEGATIVE Final   Influenza B by PCR NEGATIVE NEGATIVE Final    Comment: (NOTE) The Xpert Xpress SARS-CoV-2/FLU/RSV plus assay is intended as an aid in the diagnosis of influenza from Nasopharyngeal swab specimens and should not be used as a sole basis for treatment. Nasal washings and aspirates are unacceptable for Xpert Xpress SARS-CoV-2/FLU/RSV testing.  Fact Sheet for Patients: EntrepreneurPulse.com.au  Fact Sheet for Healthcare Providers: IncredibleEmployment.be  This test is not yet approved or cleared by the [place] FDA and has been  authorized for detection and/or diagnosis of SARS-CoV-2 by FDA under an Emergency Use Authorization (EUA). This EUA will remain in effect (meaning this test can be used) for the duration of the COVID-19 declaration under Section 564(b)(1) of the Act, 21 U.S.C. section 360bbb-3(b)(1), unless the authorization is terminated or revoked.  Performed at [facility], [address], [place], [place] [address]      Labs: Basic Metabolic Panel: Recent Labs  Lab 02/08/21 1246 02/09/21 0621  NA 134* 136  K 3.2* 4.4  CL 99 104  CO2 28 27  GLUCOSE 95 83  BUN 22 15  CREATININE 1.16 0.96  CALCIUM 8.8* 9.1  MG  --  2.1   Liver Function Tests: Recent Labs  Lab 02/08/21 1246 02/09/21 0621  AST 22 19  ALT 19 16  ALKPHOS 98 60  BILITOT 0.3 0.7  PROT 7.4 7.1  ALBUMIN 3.9 3.7   No results for input(s): LIPASE, AMYLASE in the last 168 hours. No results for input(s): AMMONIA in the last 168 hours. CBC: Recent Labs  Lab 02/08/21 1246 02/09/21 0621  WBC 6.2 6.5  HGB 11.9* 12.8*  HCT 35.3* 38.2*  MCV 88.9 90.5  PLT 292 297   Cardiac Enzymes: No results for input(s): CKTOTAL, CKMB, CKMBINDEX, TROPONINI in the last 168 hours. BNP: Invalid input(s): POCBNP CBG: No results for input(s): GLUCAP in the last 168 hours.  Time coordinating discharge:  36 minutes  Signed:  Orson Eva, DO [facility] Pager: [phone] 02/09/2021, 5:01 PM

## 2021-02-09 NOTE — Plan of Care (Signed)

## 2021-02-09 NOTE — Progress Notes (Signed)
Patient has done well overnight.  Tolerated a regular lunch tray.  Only a scant amount of blood with wiping. Rectal bleeding actually back to his chronic baseline.    Hemoglobin up to 12.8 today   Vital signs in last 24 hours: Temp:  [97.4 F (36.3 C)-98.4 F (36.9 C)] 97.6 F (36.4 C) (09/24 9311) Pulse Rate:  [61-79] 67 (09/24 0608) Resp:  [14-20] 20 (09/24 2162) BP: (111-143)/(72-90) 125/79 (09/24 0608) SpO2:  [99 %-100 %] 100 % (09/24 4469) Weight:  [87.8 kg] 87.8 kg (09/23 1639) Last BM Date: 02/08/21 General:   Alert,   pleasant and cooperative in NAD Abdomen: Positive bowel sounds soft and nontender. Extremities:  Without clubbing or edema.    Intake/Output from previous day: 09/23 0701 - 09/24 0700 In: 1647.5 [P.O.:720; I.V.:927.5] Out: 900 [Urine:900] Intake/Output this shift: No intake/output data recorded.  Lab Results: Recent Labs    02/08/21 1246 02/09/21 0621  WBC 6.2 6.5  HGB 11.9* 12.8*  HCT 35.3* 38.2*  PLT 292 297   BMET Recent Labs    02/08/21 1246 02/09/21 0621  NA 134* 136  K 3.2* 4.4  CL 99 104  CO2 28 27  GLUCOSE 95 83  BUN 22 15  CREATININE 1.16 0.96  CALCIUM 8.8* 9.1   LFT Recent Labs    02/09/21 0621  PROT 7.1  ALBUMIN 3.7  AST 19  ALT 16  ALKPHOS 60  BILITOT 0.7   PT/INR Recent Labs    02/09/21 0621  LABPROT 13.7  INR 1.1   Hepatitis Panel No results for input(s): HEPBSAG, HCVAB, HEPAIGM, HEPBIGM in the last 72 hours. C-Diff No results for input(s): CDIFFTOX in the last 72 hours.    Impression:   70 year old gentleman with very low volume painless blood per rectum.  Hemoglobin is stable, actually improved, from yesterday.  Trivial blood per rectum at this point in time.  I suspect ano-rectal origin.  Hemorrhoids versus degree of radiation-induced proctitis.  He remains quite stable.  Recommendations:   From a GI standpoint, patient can be discharged anytime.  2-week course of Anusol cream-apply to the  anorectum 3 times a day  Would avoid Aleve and other nonsteroidal agents for the time being due to antiplatelet effect; use acetaminophen as needed for aches and pains.  We will arrange follow-up with Dr. Abbey Chatters in the coming weeks.

## 2021-02-22 DIAGNOSIS — E785 Hyperlipidemia, unspecified: Secondary | ICD-10-CM | POA: Diagnosis not present

## 2021-02-22 DIAGNOSIS — I10 Essential (primary) hypertension: Secondary | ICD-10-CM | POA: Diagnosis not present

## 2021-02-25 DIAGNOSIS — E785 Hyperlipidemia, unspecified: Secondary | ICD-10-CM | POA: Diagnosis not present

## 2021-02-25 DIAGNOSIS — I10 Essential (primary) hypertension: Secondary | ICD-10-CM | POA: Diagnosis not present

## 2021-03-01 ENCOUNTER — Emergency Department (HOSPITAL_COMMUNITY)
Admission: EM | Admit: 2021-03-01 | Discharge: 2021-03-01 | Disposition: A | Discharge status: Home / Self Care | Payer: No Typology Code available for payment source | Attending: Emergency Medicine | Admitting: Emergency Medicine

## 2021-03-01 ENCOUNTER — Other Ambulatory Visit: Payer: Self-pay

## 2021-03-01 ENCOUNTER — Encounter (HOSPITAL_COMMUNITY): Payer: Self-pay

## 2021-03-01 DIAGNOSIS — Z79899 Other long term (current) drug therapy: Secondary | ICD-10-CM | POA: Diagnosis not present

## 2021-03-01 DIAGNOSIS — Z8546 Personal history of malignant neoplasm of prostate: Secondary | ICD-10-CM | POA: Diagnosis not present

## 2021-03-01 DIAGNOSIS — Z87891 Personal history of nicotine dependence: Secondary | ICD-10-CM | POA: Diagnosis not present

## 2021-03-01 DIAGNOSIS — I10 Essential (primary) hypertension: Secondary | ICD-10-CM | POA: Insufficient documentation

## 2021-03-01 DIAGNOSIS — K029 Dental caries, unspecified: Secondary | ICD-10-CM | POA: Diagnosis not present

## 2021-03-01 DIAGNOSIS — K0889 Other specified disorders of teeth and supporting structures: Secondary | ICD-10-CM | POA: Diagnosis present

## 2021-03-01 MED ORDER — TRAMADOL HCL 50 MG PO TABS
50.0000 mg | ORAL_TABLET | Freq: Once | ORAL | Status: AC
  Administered 2021-03-01: 50 mg via ORAL
  Filled 2021-03-01: qty 1

## 2021-03-01 MED ORDER — TRAMADOL HCL 50 MG PO TABS: 50.0000 mg | ORAL_TABLET | Freq: Four times a day (QID) | ORAL | 0 refills | Status: AC | PRN

## 2021-03-01 MED ORDER — PENICILLIN V POTASSIUM 250 MG PO TABS
500.0000 mg | ORAL_TABLET | Freq: Once | ORAL | Status: AC
  Administered 2021-03-01: 500 mg via ORAL
  Filled 2021-03-01: qty 2

## 2021-03-01 MED ORDER — PENICILLIN V POTASSIUM 500 MG PO TABS: 500.0000 mg | ORAL_TABLET | Freq: Three times a day (TID) | ORAL | 0 refills | Status: AC

## 2021-03-01 NOTE — ED Triage Notes (Signed)
Top left dental pain for a couple days.

## 2021-03-01 NOTE — Discharge Instructions (Addendum)
Take the antibiotic as directed.  Warm salt water rinses to your mouth.  Sure to keep your appointment with your dentist on Monday.  Return to the emergency department for any new or worsening symptoms.

## 2021-03-01 NOTE — ED Provider Notes (Signed)
[facility] EMERGENCY DEPARTMENT Provider Note   CSN: [id number] Arrival date & time: 03/01/21  2137     History Chief Complaint  Patient presents with   Dental Pain    Kent Smith is a 70 y.o. male.   Dental Pain Associated symptoms: no congestion, no facial swelling, no fever, no headaches and no neck pain       Kent Smith is a 70 y.o. male who presents to the Emergency Department complaining of left-sided dental pain for 2 days.  He is requesting antibiotics.  He describes aching pain to the left upper tooth that is gradually worsening.  States pain is radiating from his tooth into his left face toward his nose and cheek.  He tried to contact his PCP today to get antibiotics but was advised to come to the emergency department.  He denies difficulty swallowing, opening or closing his mouth, fever, chills, facial swelling and neck pain.  He has used over-the-counter toothache medication with some relief.    Past Medical History:  Diagnosis Date   History of colitis 11/2019   ED visit in epic   Hyperlipidemia    Hypertension    followed by pcp   Prostate cancer [facility]) urologist--- dr dahlstedt/  oncologist--- dr Tammi Klippel   dx 08/ 2021,  Stage T1c, Gleason 3+4   Wears glasses     Patient Active Problem List   Diagnosis Date Noted   Rectal bleed 02/08/2021   HTN (hypertension) 02/08/2021   HLD (hyperlipidemia) 02/08/2021   GI bleed 02/08/2021   Malignant neoplasm of prostate ([place]) 04/20/2020   Positive FIT (fecal immunochemical test) 02/01/2020    Past Surgical History:  Procedure Laterality Date   COLONOSCOPY WITH PROPOFOL N/A 01/07/2021   Procedure: COLONOSCOPY WITH PROPOFOL;  Surgeon: Eloise Harman, DO;  Location: [facility];  Service: Endoscopy;  Laterality: N/A;  11:45am   CYSTOSCOPY  06/29/2020   Procedure: CYSTOSCOPY FLEXIBLE;  Surgeon: Franchot Gallo, MD;  Location: [facility];  Service: Urology;;  no seeds found in bladder    INGUINAL HERNIA REPAIR Left 10-21-2002  @[facility]    POLYPECTOMY  01/07/2021   Procedure: POLYPECTOMY;  Surgeon: Eloise Harman, DO;  Location: [facility];  Service: Endoscopy;;   PROSTATE BIOPSY  08/ 2021 in urologist office   RADIOACTIVE SEED IMPLANT N/A 06/29/2020   Procedure: RADIOACTIVE SEED IMPLANT/BRACHYTHERAPY IMPLANT;  Surgeon: Franchot Gallo, MD;  Location: [facility];  Service: Urology;  Laterality: N/A;    67  seeds implanted   SPACE OAR INSTILLATION N/A 06/29/2020   Procedure: SPACE OAR INSTILLATION;  Surgeon: Franchot Gallo, MD;  Location: [facility];  Service: Urology;  Laterality: N/A;       Family History  Problem Relation Age of Onset   Hypertension Father    CAD Father    Cancer Father        type unknown   Prostate cancer Neg Hx    Colon cancer Neg Hx    Pancreatic cancer Neg Hx    Breast cancer Neg Hx     Social History   Tobacco Use   Smoking status: Former    Packs/day: 1.00    Years: 18.00    Pack years: 18.00    Types: Cigarettes    Quit date: 05/19/1986    Years since quitting: 34.8   Smokeless tobacco: Never  Vaping Use   Vaping Use: Never used  Substance Use Topics   Alcohol use:  Not Currently    Comment: 1 beer every now and then   Drug use: Not Currently    Types: Marijuana    Comment: 06-26-2020  per pt once in a awhile,  last smoked "week"  appor.x 6 months ago    Home Medications Prior to Admission medications   Medication Sig Start Date End Date Taking? Authorizing Provider  amLODipine (NORVASC) 5 MG tablet Take 5 mg by mouth daily. 09/09/19   [provider]  atorvastatin (LIPITOR) 10 MG tablet Take 10 mg by mouth daily. 10/28/19   [provider]  hydrocortisone (ANUSOL-HC) 2.5 % rectal cream Place rectally 3 (three) times daily. X 2 weeks 02/09/21   Orson Eva, MD    Allergies    Patient has no known allergies.  Review of Systems   Review of Systems  Constitutional:   Negative for chills, fatigue and fever.  HENT:  Positive for dental problem. Negative for congestion, ear pain, facial swelling, sore throat and trouble swallowing.   Eyes:  Negative for visual disturbance.  Respiratory:  Negative for shortness of breath.   Cardiovascular:  Negative for chest pain.  Gastrointestinal:  Negative for abdominal pain, nausea and vomiting.  Musculoskeletal:  Negative for neck pain and neck stiffness.  Skin:  Negative for rash.  Neurological:  Negative for dizziness, weakness, numbness and headaches.  Hematological:  Does not bruise/bleed easily.   Physical Exam Updated Vital Signs BP (!) 173/93   Pulse 98   Temp 99.4 F (37.4 C)   Resp 18   Ht 5\' 8"  (1.727 m)   Wt 87.8 kg   SpO2 100%   BMI 29.43 kg/m   Physical Exam Vitals and nursing note reviewed.  Constitutional:      General: He is not in acute distress.    Appearance: Normal appearance. He is not toxic-appearing.  HENT:     Head: Atraumatic.     Mouth/Throat:     Mouth: Mucous membranes are moist.     Dentition: Dental tenderness and dental caries present. No gingival swelling.     Pharynx: Oropharynx is clear. Uvula midline. No uvula swelling.     Comments: Tender to palpation of the left upper first premolar.  Dental caries present.  No erythema or fluctuance of the surrounding gingiva.  No facial edema.  No trismus or sublingual abnormalities.  Uvula midline nonedematous. Cardiovascular:     Rate and Rhythm: Normal rate and regular rhythm.     Pulses: Normal pulses.  Pulmonary:     Effort: Pulmonary effort is normal.  Musculoskeletal:        General: Normal range of motion.     Cervical back: Normal range of motion. No tenderness.  Lymphadenopathy:     Cervical: No cervical adenopathy.  Skin:    General: Skin is warm.  Neurological:     General: No focal deficit present.     Sensory: No sensory deficit.     Motor: No weakness.    ED Results / Procedures / Treatments    Labs (all labs ordered are listed, but only abnormal results are displayed) Labs Reviewed - No data to display  EKG None  Radiology No results found.  Procedures Procedures   Medications Ordered in ED Medications  penicillin v potassium (VEETID) tablet 500 mg (500 mg Oral Given 03/01/21 2212)  traMADol (ULTRAM) tablet 50 mg (50 mg Oral Given 03/01/21 2212)    ED Course  I have reviewed the triage vital signs and the  nursing notes.  Pertinent labs & imaging results that were available during my care of the patient were reviewed by me and considered in my medical decision making (see chart for details).    MDM Rules/Calculators/A&P                           Patient here requesting antibiotics for dental pain.  Reports left upper tooth pain x2 days.  Has an appointment to see his dentist on Monday.  On exam, patient well-appearing nontoxic.  He does have tenderness of a left upper tooth without evidence of abscess.  No trismus.  No concerning symptoms for Ludewig's angina.  Patient appropriate for discharge home.  History of GI bleed so will refrain from NSAID use.   Final Clinical Impression(s) / ED Diagnoses Final diagnoses:  Pain, dental    Rx / DC Orders ED Discharge Orders     None        Bufford Lope 03/01/21 2245    Luna Fuse, MD 03/08/21 [phone]

## 2021-03-04 ENCOUNTER — Other Ambulatory Visit: Payer: Self-pay

## 2021-03-04 NOTE — Patient Outreach (Signed)
[facility]) Care Management  03/04/2021  Kent Smith May 28, 1950 [id number]   Telephone Assessment    Successful outreach call placed to patient. Spoke with patient who is just getting off work. He reports he is headed t run some errands. He has dental appt as well. Patient continues to reside in his home along with his brother. He is independent with ADLs/IADLs. Denies any recent falls. Appetite good and wgt stable. He had one episode of some light bleeding during BM and was evaluated. He has colonoscopy in Aug. He reports that they found two polyps which were benign.  He denies any RN CM needs or concerns at this time. Unable to review meds as patient currently not at home. He reports he was recently started a on a "green pill" at night to help with rest/relax and its working but he is unable to recall name of med.    Medications Reviewed Today     Reviewed by Scotty Court, CPhT (Pharmacy Technician) on 02/08/21 at 1310  Med List Status: Complete   Medication Order Taking? Sig Documenting Provider Last Dose Status Informant  amLODipine (NORVASC) 5 MG tablet [id number] Yes Take 5 mg by mouth daily. [provider] 02/07/2021 Active Self  atorvastatin (LIPITOR) 10 MG tablet [id number] Yes Take 10 mg by mouth daily. [provider] 02/08/2021 Active Self  losartan-hydrochlorothiazide (HYZAAR) 100-25 MG tablet [id number] Yes Take 1 tablet by mouth in the morning. [provider] 02/08/2021 Active Self  naproxen sodium (ALEVE) 220 MG tablet [id number] Yes Take 220 mg by mouth daily as needed (pain/headache). [provider] PRN Active Self               Goals Addressed               This Visit's Progress     (THN)Track and Manage My Blood Pressure-Hypertension (pt-stated)        Timeframe:  Long-Range Goal Priority:  High Start Date: 10/19/2020                            Expected End Date:  09/15/20                     Follow  Up Date Jan 2023  Barriers: Health Behaviors Knowledge    - check blood pressure 3 times per week - write blood pressure results in a log or diary    Why is this important?   You won't feel high blood pressure, but it can still hurt your blood vessels.  High blood pressure can cause heart or kidney problems. It can also cause a stroke.  Making lifestyle changes like losing a little weight or eating less salt will help.  Checking your blood pressure at home and at different times of the day can help to control blood pressure.  If the doctor prescribes medicine remember to take it the way the doctor ordered.  Call the office if you cannot afford the medicine or if there are questions about it.     Notes:  10/19/2020-Patient reports that hs is checking BP in the home about 2-3x/wk. BP ranging in the 130-140s.  12/18/20-Patient reports his BP readings have been in the 130s. He is pleased at how well he has been able to manage BP thus far. He is checking BP about 3-4x/wk. He voices he is trying to adhere to low salt diet.   03/04/21-Patient states  BP has been WNL for him-ranging in the 130-140s. He continues to self monitor B in the home several times per week. States he is adhering to low salt diet and restrictions.      (THNInfection Prevented or Managed (pt-stated)          Timeframe:  Short-Term Goal Priority:  High Start Date:   03/04/21                          Expected End Date:   Nov 2022 Follow Up Date: Jan 2022   Barriers: Health Behaviors Knowledge  -pt will complete abx therapy -pt will f/u with dentist as ordered -pt will adhere to daily oral care/hygiene         Notes: 03/04/21-Patient reports he ha dental abscess-diagnosed during recent ED visit. He was given ab therapy which he is taking. He has dental appt this afternoon and possible extraction.         Plan:   RN CM discussed with patient next outreach within the month of Jan. Patient agrees to care plan  and follow up. Patient gave verbal consent and in agreement with RN CM follow up and timeframe. Patient aware that they may contact RN CM sooner for any issues or concerns. RN CM reviewed goals and plan of care with patient. RN CM will send quarterly update to PCP.   Enzo Montgomery, RN,BSN,CCM [facility] Telephonic Care Management Coordinator Direct Phone: [phone] Toll Free: [phone] Fax: [phone]

## 2021-03-18 DIAGNOSIS — H5200 Hypermetropia, unspecified eye: Secondary | ICD-10-CM | POA: Diagnosis not present

## 2021-03-18 DIAGNOSIS — Z0100 Encounter for examination of eyes and vision without abnormal findings: Secondary | ICD-10-CM | POA: Diagnosis not present

## 2021-03-25 ENCOUNTER — Encounter: Payer: Self-pay | Admitting: Internal Medicine

## 2021-03-28 DIAGNOSIS — I10 Essential (primary) hypertension: Secondary | ICD-10-CM | POA: Diagnosis not present

## 2021-03-28 DIAGNOSIS — C61 Malignant neoplasm of prostate: Secondary | ICD-10-CM | POA: Diagnosis not present

## 2021-04-02 ENCOUNTER — Telehealth: Payer: Self-pay

## 2021-04-02 NOTE — Telephone Encounter (Signed)
Pt received letter to schedule an appt. Please return pt's call at [phone].

## 2021-04-18 IMAGING — US US GUIDANCE NEEDLE PLACEMENT
1 series · 14 of 19 positions shown · non-contrast
Comparison: None.

CLINICAL DATA: Prostate biopsy.

EXAM:
IR ULTRASOUND GUIDED ASPIRATION/DRAINAGE
TECHNIQUE: Ultrasound guidance was provided for prostate biopsy.

[Series 1: us prostate biopsy multiple · 14 of 19 slices shown]
[im 1/19]
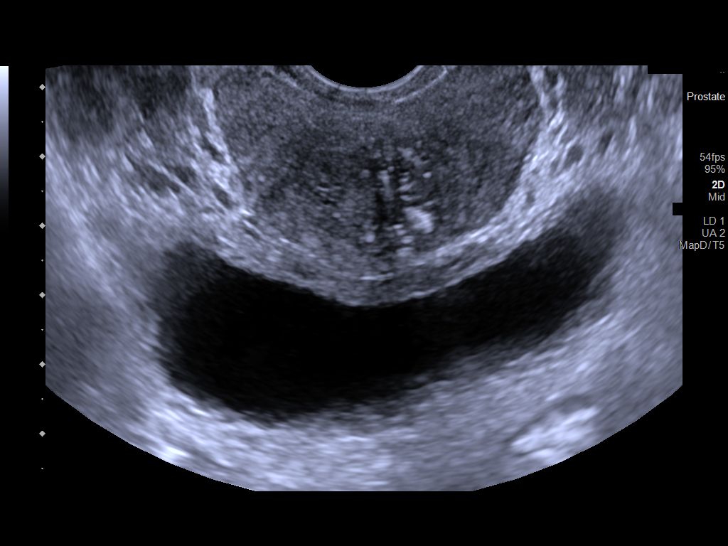
[im 3/19]
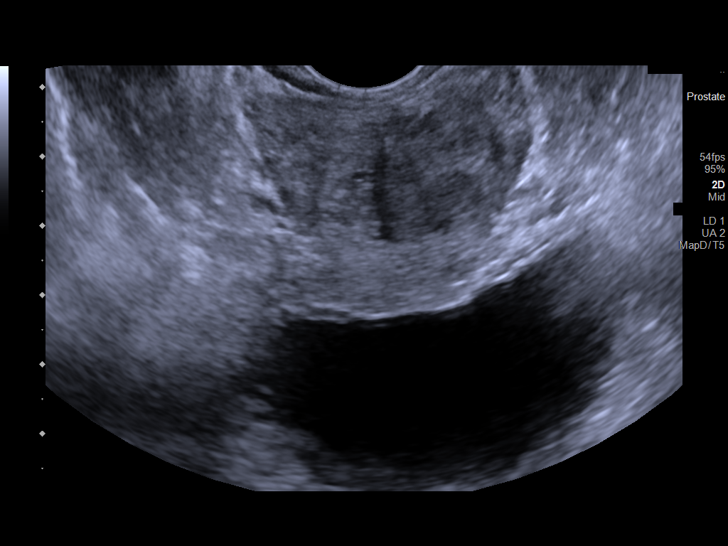
[im 4/19]
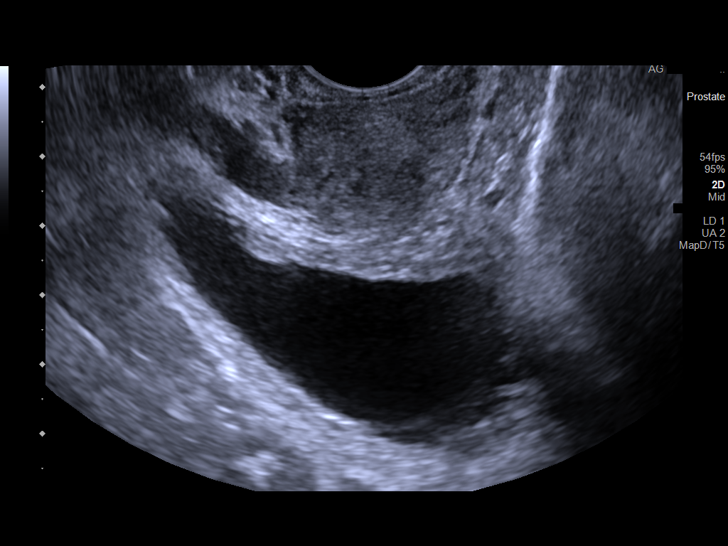
[im 5/19]
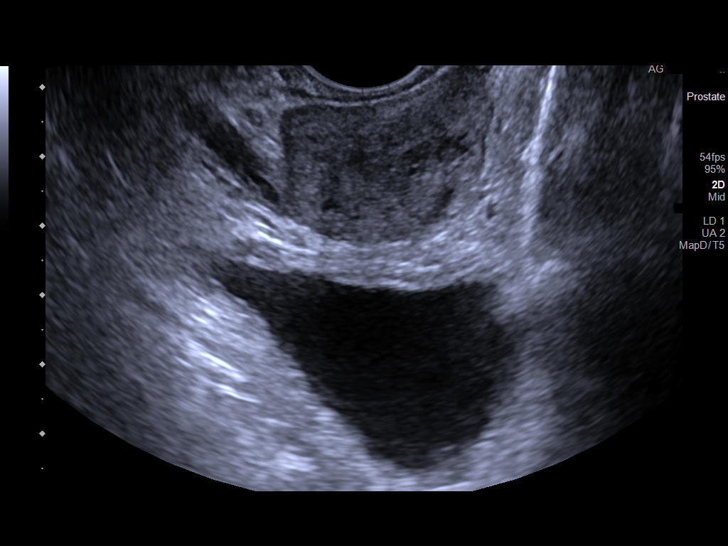
[im 7/19]
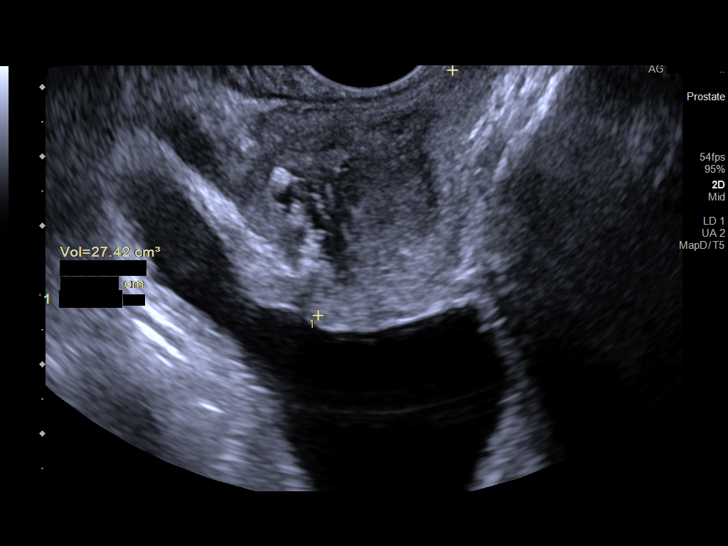
[im 8/19]
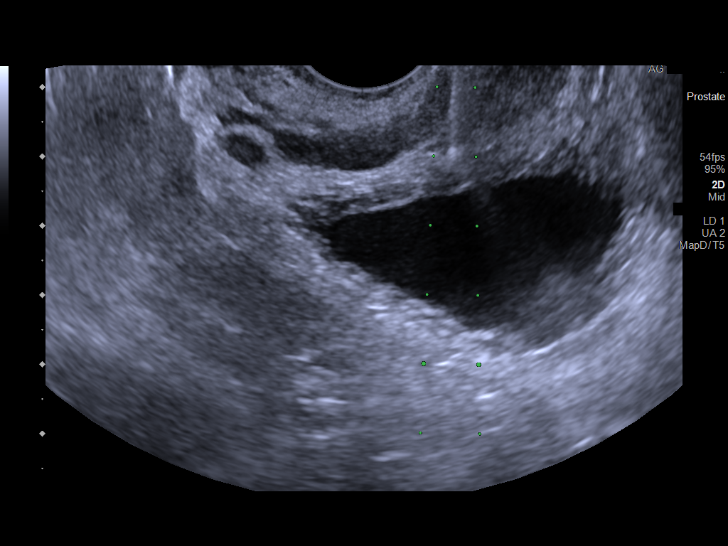
[im 9/19]
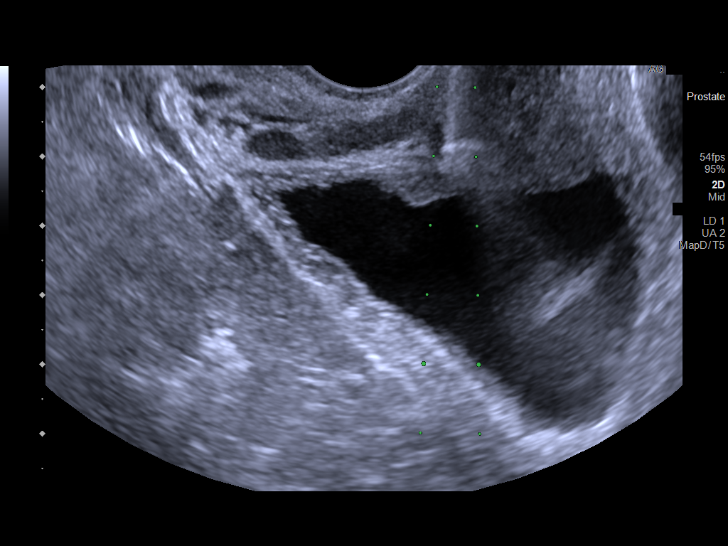
[im 11/19]
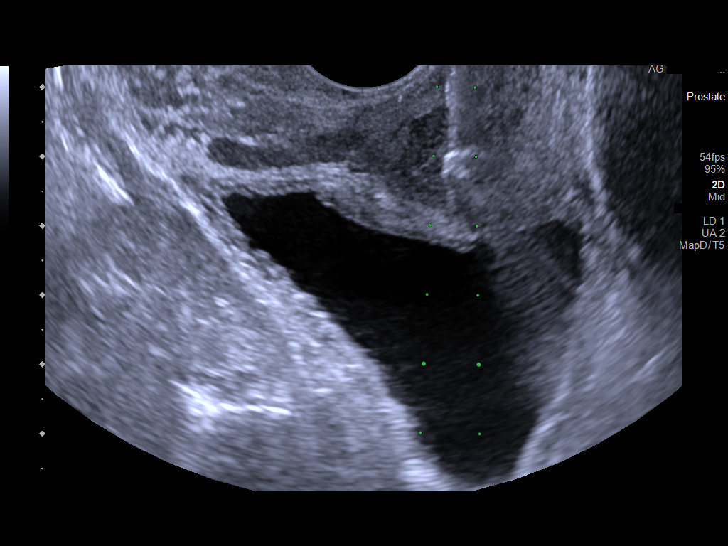
[im 12/19]
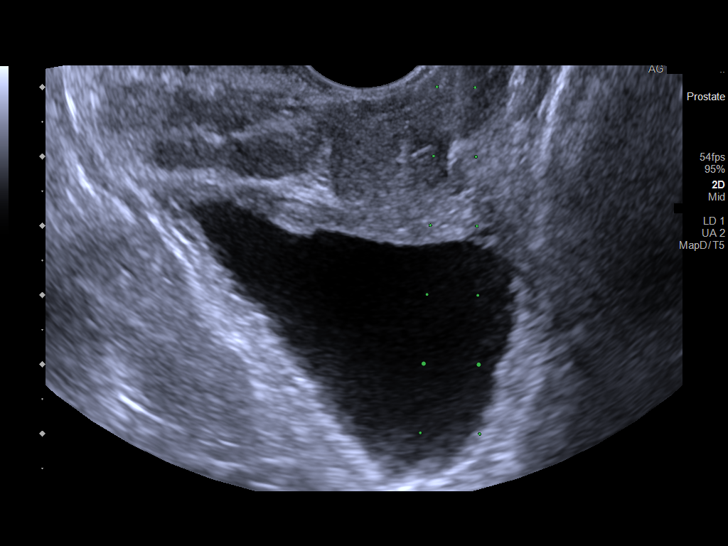
[im 13/19]
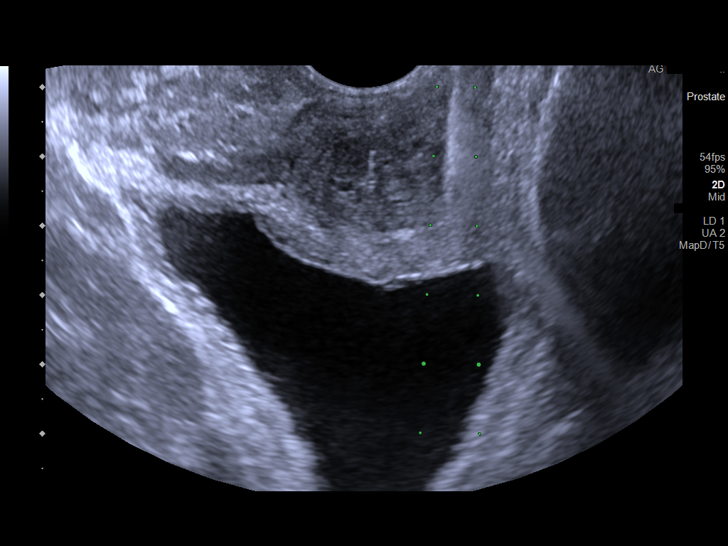
[im 15/19]
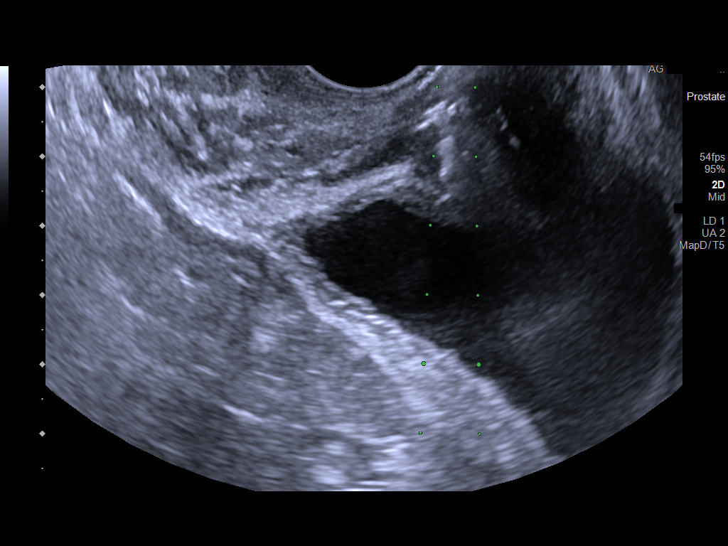
[im 16/19]
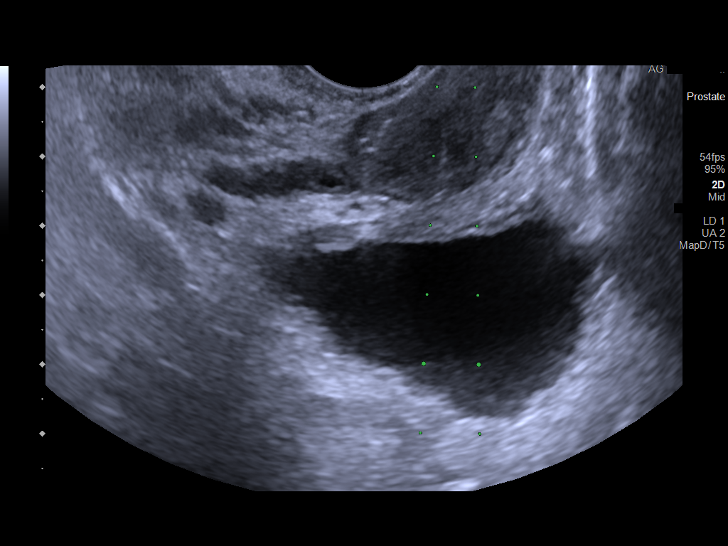
[im 17/19]
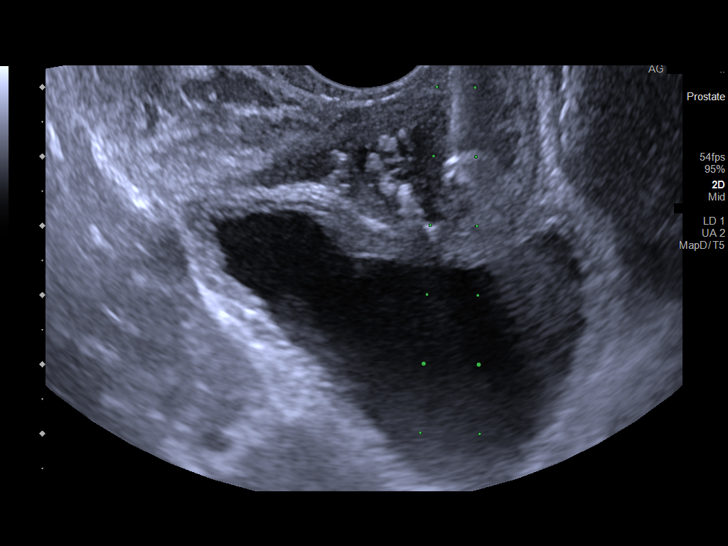
[im 19/19]
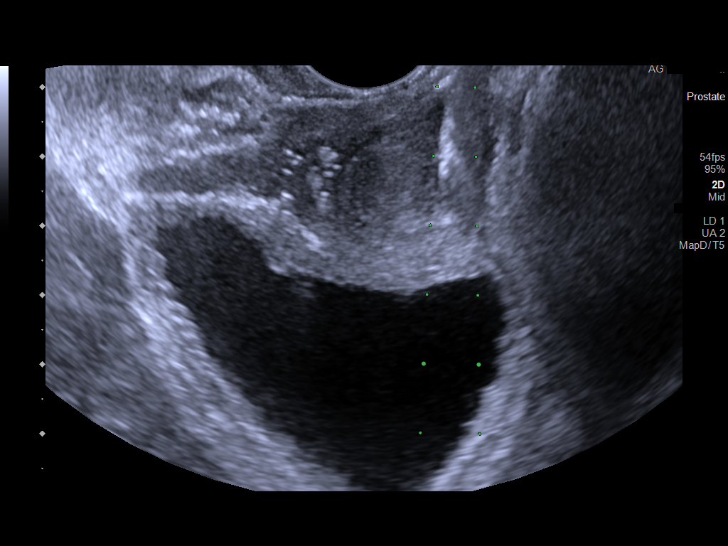

[14 of 19 positions shown; findings below may reference images not displayed]

IMPRESSION: Ultrasound guidance provided for prostate biopsy.

## 2021-04-29 DIAGNOSIS — L0212 Furuncle of neck: Secondary | ICD-10-CM | POA: Diagnosis not present

## 2021-04-29 DIAGNOSIS — F419 Anxiety disorder, unspecified: Secondary | ICD-10-CM | POA: Diagnosis not present

## 2021-04-29 DIAGNOSIS — E785 Hyperlipidemia, unspecified: Secondary | ICD-10-CM | POA: Diagnosis not present

## 2021-04-29 DIAGNOSIS — I10 Essential (primary) hypertension: Secondary | ICD-10-CM | POA: Diagnosis not present

## 2021-05-15 ENCOUNTER — Ambulatory Visit: Payer: No Typology Code available for payment source | Admitting: Internal Medicine

## 2021-05-28 ENCOUNTER — Other Ambulatory Visit: Payer: Self-pay

## 2021-05-28 NOTE — Patient Outreach (Signed)
[facility] [facility]) Care Management  05/28/2021  MARDY LUCIER 1950-08-29 [id number]   Telephone Assessment    Unsuccessful outreach attempt patient. RN CM left HIPAA compliant voicemail message along with contact info.    Plan: RN CM will make outreach attempt to patient within the month of Feb if no return call.   Enzo Montgomery, RN,BSN,CCM [place] Management Telephonic Care Management Coordinator Direct Phone: [phone] Toll Free: [phone] Fax: [phone]

## 2021-05-29 ENCOUNTER — Ambulatory Visit: Payer: Self-pay

## 2021-05-30 DIAGNOSIS — C61 Malignant neoplasm of prostate: Secondary | ICD-10-CM | POA: Diagnosis not present

## 2021-05-30 DIAGNOSIS — I10 Essential (primary) hypertension: Secondary | ICD-10-CM | POA: Diagnosis not present

## 2021-06-30 DIAGNOSIS — C61 Malignant neoplasm of prostate: Secondary | ICD-10-CM | POA: Diagnosis not present

## 2021-06-30 DIAGNOSIS — I10 Essential (primary) hypertension: Secondary | ICD-10-CM | POA: Diagnosis not present

## 2021-07-08 ENCOUNTER — Emergency Department (HOSPITAL_COMMUNITY)
Admission: EM | Admit: 2021-07-08 | Discharge: 2021-07-08 | Disposition: A | Discharge status: Home / Self Care | Payer: Medicare HMO | Attending: Emergency Medicine | Admitting: Emergency Medicine

## 2021-07-08 ENCOUNTER — Encounter (HOSPITAL_COMMUNITY): Payer: Self-pay | Admitting: *Deleted

## 2021-07-08 ENCOUNTER — Emergency Department (HOSPITAL_COMMUNITY): Payer: Medicare HMO

## 2021-07-08 ENCOUNTER — Other Ambulatory Visit: Payer: Self-pay

## 2021-07-08 DIAGNOSIS — R079 Chest pain, unspecified: Secondary | ICD-10-CM

## 2021-07-08 DIAGNOSIS — I10 Essential (primary) hypertension: Secondary | ICD-10-CM | POA: Insufficient documentation

## 2021-07-08 DIAGNOSIS — Z79899 Other long term (current) drug therapy: Secondary | ICD-10-CM | POA: Insufficient documentation

## 2021-07-08 DIAGNOSIS — N50819 Testicular pain, unspecified: Secondary | ICD-10-CM | POA: Insufficient documentation

## 2021-07-08 DIAGNOSIS — R1030 Lower abdominal pain, unspecified: Secondary | ICD-10-CM | POA: Insufficient documentation

## 2021-07-08 DIAGNOSIS — Z8546 Personal history of malignant neoplasm of prostate: Secondary | ICD-10-CM | POA: Insufficient documentation

## 2021-07-08 DIAGNOSIS — N433 Hydrocele, unspecified: Secondary | ICD-10-CM | POA: Diagnosis not present

## 2021-07-08 DIAGNOSIS — R059 Cough, unspecified: Secondary | ICD-10-CM | POA: Insufficient documentation

## 2021-07-08 DIAGNOSIS — R0789 Other chest pain: Secondary | ICD-10-CM | POA: Diagnosis not present

## 2021-07-08 LAB — URINALYSIS, ROUTINE W REFLEX MICROSCOPIC
Bilirubin Urine: NEGATIVE
Glucose, UA: NEGATIVE mg/dL
Hgb urine dipstick: NEGATIVE
Ketones, ur: NEGATIVE mg/dL
Leukocytes,Ua: NEGATIVE
Nitrite: NEGATIVE
Protein, ur: NEGATIVE mg/dL
Specific Gravity, Urine: 1.005 (ref 1.005–1.030)
pH: 7 (ref 5.0–8.0)

## 2021-07-08 LAB — BASIC METABOLIC PANEL
Anion gap: 8 (ref 5–15)
BUN: 18 mg/dL (ref 8–23)
CO2: 28 mmol/L (ref 22–32)
Calcium: 9.5 mg/dL (ref 8.9–10.3)
Chloride: 98 mmol/L (ref 98–111)
Creatinine, Ser: 1.1 mg/dL (ref 0.61–1.24)
GFR, Estimated: >60 mL/min (ref >60)
Glucose, Bld: 90 mg/dL (ref 70–99)
Potassium: 3.5 mmol/L (ref 3.5–5.1)
Sodium: 134 mmol/L — ABNORMAL LOW (ref 135–145)

## 2021-07-08 LAB — TROPONIN I (HIGH SENSITIVITY)
Troponin I (High Sensitivity): <2 ng/L (ref <18)
Troponin I (High Sensitivity): <2 ng/L (ref <18)

## 2021-07-08 LAB — CBC
HCT: 40.1 % (ref 39.0–52.0)
Hemoglobin: 13.2 g/dL (ref 13.0–17.0)
MCH: 28.8 pg (ref 26.0–34.0)
MCHC: 32.9 g/dL (ref 30.0–36.0)
MCV: 87.6 fL (ref 80.0–100.0)
Platelets: 256 10*3/uL (ref 150–400)
RBC: 4.58 MIL/uL (ref 4.22–5.81)
RDW: 13.8 % (ref 11.5–15.5)
WBC: 5.3 10*3/uL (ref 4.0–10.5)
nRBC: 0 % (ref 0.0–0.2)

## 2021-07-08 NOTE — ED Notes (Signed)
Patient transported to Ultrasound 

## 2021-07-08 NOTE — Discharge Instructions (Addendum)
Follow-up with your doctor for the pain in your groin and the chest pain.  Your work-up here today was reassuring. Also follow-up with Dr. Diona Fanti as planned

## 2021-07-08 NOTE — ED Provider Notes (Signed)
[facility] EMERGENCY DEPARTMENT Provider Note   CSN: [id number] Arrival date & time: 07/08/21  8315     History  Chief Complaint  Patient presents with   Chest Pain   Groin Pain    Kent Smith is a 71 y.o. male.   Chest Pain Associated symptoms: no abdominal pain, no back pain and no shortness of breath   Groin Pain Associated symptoms include chest pain. Pertinent negatives include no abdominal pain and no shortness of breath.  Patient presents chest pain.  Has had for the last couple weeks.  Comes and goes.  Not exertional.  Kent Smith.  Worse with certain movements.  Worse when he presses on the area.  Has had a little bit of a cough at times.  Sometimes nausea with the episode.  Does not feel heart racing or going slow. Also has had groin/inguinal pain.  States feels like it is in his testicles.  No dysuria.  No fevers.  Denies penile discharge.   Past Medical History:  Diagnosis Date   History of colitis 11/2019   ED visit in epic   Hyperlipidemia    Hypertension    followed by pcp   Prostate cancer [facility]) urologist--- Kent Smith/  oncologist--- Kent Smith   dx 08/ 2021,  Stage T1c, Gleason 3+4   Wears glasses     Home Medications Prior to Admission medications   Medication Sig Start Date End Date Taking? Authorizing Provider  amLODipine (NORVASC) 5 MG tablet Take 5 mg by mouth daily. 09/09/19   [provider]  atorvastatin (LIPITOR) 10 MG tablet Take 10 mg by mouth daily. 10/28/19   [provider]  hydrocortisone (ANUSOL-HC) 2.5 % rectal cream Place rectally 3 (three) times daily. X 2 weeks 02/09/21   Kent Smith, Kent Brow, MD  traMADol (ULTRAM) 50 MG tablet Take 1 tablet (50 mg total) by mouth every 6 (six) hours as needed. 03/01/21   Kent Smith, Tammy, PA-C      Allergies    Patient has no known allergies.    Review of Systems   Review of Systems  Constitutional:  Negative for appetite change.  Respiratory:  Negative for shortness of breath.    Cardiovascular:  Positive for chest pain.  Gastrointestinal:  Negative for abdominal pain.  Genitourinary:  Positive for testicular pain.  Musculoskeletal:  Negative for back pain.  Neurological:  Negative for seizures.   Physical Exam Updated Vital Signs BP 107/74    Pulse 65    Temp 98 F (36.7 C) (Oral)    Resp 18    Ht 5\' 8"  (1.727 m)    Wt 88 kg    SpO2 100%    BMI 29.50 kg/m  Physical Exam Vitals reviewed.  HENT:     Head: Normocephalic.  Cardiovascular:     Rate and Rhythm: Regular rhythm.  Pulmonary:     Breath sounds: No wheezing.  Chest:     Chest wall: Tenderness present.     Comments: Tenderness right lateral lower chest wall.  No rash.  No crepitance deformity. Abdominal:     Tenderness: There is no abdominal tenderness.  Genitourinary:    Comments: Tenderness to right hemiscrotum.  No rash.  No penile discharge. Musculoskeletal:     Right lower leg: No edema.     Left lower leg: No edema.  Neurological:     Mental Status: He is alert.    ED Results / Procedures / Treatments   Labs (all labs ordered are listed,  but only abnormal results are displayed) Labs Reviewed  BASIC METABOLIC PANEL - Abnormal; Notable for the following components:      Result Value   Sodium 134 (*)    All other components within normal limits  URINALYSIS, ROUTINE W REFLEX MICROSCOPIC - Abnormal; Notable for the following components:   Color, Urine STRAW (*)    All other components within normal limits  CBC  TROPONIN I (HIGH SENSITIVITY)  TROPONIN I (HIGH SENSITIVITY)    EKG EKG Interpretation  Date/Time:  Monday July 08 2021 09:19:29 EST Ventricular Rate:  77 PR Interval:  205 QRS Duration: 90 QT Interval:  359 QTC Calculation: 407 R Axis:   64 Text Interpretation: Sinus rhythm Low voltage, precordial leads No significant change since last tracing Confirmed by Kent Smith [phone]) on 07/08/2021 9:27:11 AM  Radiology DG Chest 2 View  Result Date:  07/08/2021 CLINICAL DATA:  A 71 year old male presents for evaluation of chest pain. EXAM: CHEST - 2 VIEW COMPARISON:  None FINDINGS: EKG leads project over the chest. Cardiomediastinal contours and hilar structures are normal. Lungs are clear.  No visible pneumothorax. No signs of pleural effusion. On limited assessment no acute skeletal findings. IMPRESSION: No acute cardiopulmonary disease. Electronically Signed   By: Kent Smith M.D.   On: 07/08/2021 10:41   US SCROTUM W/DOPPLER  Result Date: 07/08/2021 CLINICAL DATA:  Pain in the RIGHT groin for 2-3 months EXAM: SCROTAL ULTRASOUND DOPPLER ULTRASOUND OF THE TESTICLES TECHNIQUE: Complete ultrasound examination of the testicles, epididymis, and other scrotal structures was performed. Color and spectral Doppler ultrasound were also utilized to evaluate blood flow to the testicles. COMPARISON:  None FINDINGS: Right testicle Measurements: 3.6 x 2.2 x 2.8 cm. No mass or microlithiasis visualized. Left testicle Measurements: 3.7 x 2.3 x 2.6 cm. No mass or microlithiasis visualized. Right epididymis:  Normal in size and appearance. Left epididymis:  Normal in size and appearance. Hydrocele:  Very small LEFT hydrocele. Varicocele:  None visualized. Pulsed Doppler interrogation of both testes demonstrates normal low resistance arterial and venous waveforms bilaterally. IMPRESSION: No acute findings relative to the scrotum ir testes. Small LEFT hydrocele and no sonographic evidence of testicular torsion. Electronically Signed   By: Kent Smith M.D.   On: 07/08/2021 10:40    Procedures Procedures    Medications Ordered in ED Medications - No data to display  ED Course/ Medical Decision Making/ A&P                           Medical Decision Making Amount and/or Complexity of Data Reviewed Labs: ordered. Radiology: ordered. ECG/medicine tests: ordered.   Patient chest pain.  Initial differential diagnosis is long and includes life-threatening  conditions such as coronary disease/ACS. Chest x-ray reassuring.  EKG reassuring troponin negative.  Doubt pulm embolism.  Low risk.  No pneumonia.  Potentially musculoskeletal since reproducible pain.  Appears stable for discharge home.  No rash yet but the early zoster still considered. Also with groin/testicle pain.  Right-sided.  Urine reassuring without infection.  Ultrasound done and reassuring.  No torsion or clear cause of pain.  Will discharge home.  Has seen Kent. Diona Fanti and does have history of prostate cancer.  Short-term follow-up will be good form.  Will discharge home. I have independently interpreted the chest x-ray.        Final Clinical Impression(s) / ED Diagnoses Final diagnoses:  Nonspecific chest pain  Inguinal pain, unspecified laterality  Rx / DC Orders ED Discharge Orders     None         Kent Belling, MD 07/08/21 [phone]

## 2021-07-08 NOTE — ED Triage Notes (Addendum)
Pt c/o right lower chest pain and right groin pain intermittently x 2-3 months. Denies urinary symptoms. Pt does report nausea and dizziness when the chest pain comes on.

## 2021-07-09 ENCOUNTER — Other Ambulatory Visit: Payer: Self-pay

## 2021-07-09 NOTE — Patient Outreach (Signed)
[facility]) Care Management  07/09/2021  RHYKER SILVERSMITH December 24, 1950 [id number]   Telephone Assessment    Unsuccessful outreach attempt to patient.      Plan: RN CM will make outreach attempt within the month of March.  RN CM will send unsuccessful outreach letter to patient.   Enzo Montgomery, RN,BSN,CCM [place] Management Telephonic Care Management Coordinator Direct Phone: [phone] Toll Free: [phone] Fax: [phone]

## 2021-07-10 ENCOUNTER — Ambulatory Visit: Payer: Self-pay

## 2021-07-22 DIAGNOSIS — N432 Other hydrocele: Secondary | ICD-10-CM | POA: Diagnosis not present

## 2021-07-22 DIAGNOSIS — I10 Essential (primary) hypertension: Secondary | ICD-10-CM | POA: Diagnosis not present

## 2021-07-22 DIAGNOSIS — R109 Unspecified abdominal pain: Secondary | ICD-10-CM | POA: Diagnosis not present

## 2021-07-23 ENCOUNTER — Ambulatory Visit (INDEPENDENT_AMBULATORY_CARE_PROVIDER_SITE_OTHER): Payer: Medicare HMO | Admitting: Urology

## 2021-07-23 ENCOUNTER — Encounter: Payer: Self-pay | Admitting: Urology

## 2021-07-23 ENCOUNTER — Other Ambulatory Visit: Payer: Self-pay

## 2021-07-23 VITALS — BP 157/82 | HR 86 | Ht 68.0 in | Wt 196.4 lb

## 2021-07-23 DIAGNOSIS — R3129 Other microscopic hematuria: Secondary | ICD-10-CM

## 2021-07-23 DIAGNOSIS — C61 Malignant neoplasm of prostate: Secondary | ICD-10-CM | POA: Diagnosis not present

## 2021-07-23 LAB — URINALYSIS, ROUTINE W REFLEX MICROSCOPIC
Bilirubin, UA: NEGATIVE
Glucose, UA: NEGATIVE
Ketones, UA: NEGATIVE
Leukocytes,UA: NEGATIVE
Nitrite, UA: NEGATIVE
Protein,UA: NEGATIVE
Specific Gravity, UA: >1.03 — ABNORMAL HIGH (ref 1.005–1.030)
Urobilinogen, Ur: 0.2 mg/dL (ref 0.2–1.0)
pH, UA: 5.5 (ref 5.0–7.5)

## 2021-07-23 LAB — MICROSCOPIC EXAMINATION
Renal Epithel, UA: NONE SEEN /hpf
WBC, UA: NONE SEEN /hpf (ref 0–5)

## 2021-07-23 NOTE — Progress Notes (Signed)
?History of Present Illness:  ?8.31.2021: TRUS/Bx.  At that time, PSA 5.3, prostate volume 27 mL, PSA density 0.20. ?  ?2/12 cores revealed adenocarcinoma-left mid medial core GS 3+4 and 40% of core, left apex medial, GS 3+4 in 40% of sample. ?  ?2.11.2022: He underwent I-125 brachytherapy/SpaceOAR placement ? ?3.7.2023: No blood in urine/stool. IPSS 8  QoL score 0.  He is on no pharmacologic management of bladder/prostate. ?Past Medical History:  ?Diagnosis Date  ? History of colitis 11/2019  ? ED visit in epic  ? Hyperlipidemia   ? Hypertension   ? followed by pcp  ? Prostate cancer [facility]) urologist--- dr Jerimy Johanson/  oncologist--- dr Tammi Klippel  ? dx 08/ 2021,  Stage T1c, Gleason 3+4  ? Wears glasses   ? ? ?Past Surgical History:  ?Procedure Laterality Date  ? COLONOSCOPY WITH PROPOFOL N/A 01/07/2021  ? Procedure: COLONOSCOPY WITH PROPOFOL;  Surgeon: Eloise Harman, DO;  [facility];  Service: Endoscopy;  Laterality: N/A;  11:45am  ? CYSTOSCOPY  06/29/2020  ? Procedure: CYSTOSCOPY FLEXIBLE;  Surgeon: Franchot Gallo, MD;  Location: [facility];  Service: Urology;;  no seeds found in bladder  ? INGUINAL HERNIA REPAIR Left 10-21-2002  '@[facility]'$   ? POLYPECTOMY  01/07/2021  ? Procedure: POLYPECTOMY;  Surgeon: Eloise Harman, DO;  [facility];  Service: Endoscopy;;  ? PROSTATE BIOPSY  08/ 2021 in urologist office  ? RADIOACTIVE SEED IMPLANT N/A 06/29/2020  ? Procedure: RADIOACTIVE SEED IMPLANT/BRACHYTHERAPY IMPLANT;  Surgeon: Franchot Gallo, MD;  Location: [facility];  Service: Urology;  Laterality: N/A;    68  seeds implanted  ? SPACE OAR INSTILLATION N/A 06/29/2020  ? Procedure: SPACE OAR INSTILLATION;  Surgeon: Franchot Gallo, MD;  Location: [facility];  Service: Urology;  Laterality: N/A;  ? ? ?Home Medications:  ?Allergies as of 07/23/2021   ?No Known Allergies ?  ? ?  ?Medication List  ?  ? ?  ? Accurate as of July 23, 2021  8:16 AM. If you  have any questions, ask your nurse or doctor.  ?  ?  ? ?  ? ?amLODipine 5 MG tablet ?Commonly known as: NORVASC ?Take 5 mg by mouth daily. ?  ?atorvastatin 10 MG tablet ?Commonly known as: LIPITOR ?Take 10 mg by mouth daily. ?  ?hydrocortisone 2.5 % rectal cream ?Commonly known as: ANUSOL-HC ?Place rectally 3 (three) times daily. X 2 weeks ?  ?traMADol 50 MG tablet ?Commonly known as: ULTRAM ?Take 1 tablet (50 mg total) by mouth every 6 (six) hours as needed. ?  ? ?  ? ? ?Allergies: No Known Allergies ? ?Family History  ?Problem Relation Age of Onset  ? Hypertension Father   ? CAD Father   ? Cancer Father   ?     type unknown  ? Prostate cancer Neg Hx   ? Colon cancer Neg Hx   ? Pancreatic cancer Neg Hx   ? Breast cancer Neg Hx   ? ? ?Social History:  reports that he quit smoking about 35 years ago. His smoking use included cigarettes. He has a 18.00 pack-year smoking history. He has never used smokeless tobacco. He reports current alcohol use. He reports current drug use. Drug: Marijuana. ? ?ROS: ?A complete review of systems was performed.  All systems are negative except for pertinent findings as noted. ? ?Physical Exam:  ?Vital signs in last 24 hours: ?There were no vitals taken for this visit. ?Constitutional:  Alert and oriented, No acute distress ?Cardiovascular: Regular rate  ?Respiratory: Normal respiratory effort ?Neurologic: Grossly intact, no focal deficits ?Psychiatric: Normal mood and affect ? ?I have reviewed prior pt notes ? ?I have reviewed urinalysis results ? ?I have reviewed prior PSA/pathology  results ? ? ? ?Impression/Assessment:  ?History of prostate cancer, grade group 2, low-volume, status post brachytherapy just over a year ago.  Overall doing well ? ?Microscopic hematuria noted on urine today.  No history of gross hematuria ? ?Plan:  ?PSA will be checked today ? ?I will have him come in routinely in 6 months, recheck urine at that time as well as DRE/PSA ? ?

## 2021-07-24 LAB — PSA: Prostate Specific Ag, Serum: 1.1 ng/mL (ref 0.0–4.0)

## 2021-07-28 DIAGNOSIS — I10 Essential (primary) hypertension: Secondary | ICD-10-CM | POA: Diagnosis not present

## 2021-07-28 DIAGNOSIS — C61 Malignant neoplasm of prostate: Secondary | ICD-10-CM | POA: Diagnosis not present

## 2021-08-07 ENCOUNTER — Other Ambulatory Visit: Payer: Self-pay

## 2021-08-07 NOTE — Patient Outreach (Signed)
[place] [facility]) Care Management ? ?08/07/2021 ? ?Ralene Bathe ?[birth year] ?[id number] ? ? ? ?Telephone Assessment ? ? ? ?Outreach attempt to patient. No answer after multiple rings and unable to leave message.  ? ? ? ?Plan: ?RN CM will make outreach attempt within the month of May. ? ?Enzo Montgomery, RN,BSN,CCM ?[facility] Care Management ?Telephonic Care Management Coordinator ?Direct Phone: [phone] ?Toll Free: [phone] ?Fax: [phone] ? ?

## 2021-08-22 DIAGNOSIS — I10 Essential (primary) hypertension: Secondary | ICD-10-CM | POA: Diagnosis not present

## 2021-08-22 DIAGNOSIS — C61 Malignant neoplasm of prostate: Secondary | ICD-10-CM | POA: Diagnosis not present

## 2021-09-21 DIAGNOSIS — C61 Malignant neoplasm of prostate: Secondary | ICD-10-CM | POA: Diagnosis not present

## 2021-09-21 DIAGNOSIS — I10 Essential (primary) hypertension: Secondary | ICD-10-CM | POA: Diagnosis not present

## 2021-10-09 ENCOUNTER — Other Ambulatory Visit: Payer: Self-pay

## 2021-10-09 NOTE — Patient Outreach (Signed)
[place] [facility]) Care Management  10/09/2021  JOVE BEYL [birth year] [id number]   Telephone Assessment    Unsuccessful outreach attempt to patient.    Plan: RN CM will make outreach attempt within the month of July if no return call.  Enzo Montgomery, RN,BSN,CCM [place] Management Telephonic Care Management Coordinator Direct Phone: [phone] Toll Free: [phone] Fax: [phone]

## 2021-10-09 NOTE — Patient Outreach (Signed)
[place] [facility]) Care Management  10/09/2021  Kent Smith 1951-02-01 [id number]   Telephone Assessment Annual Assessment   Incoming call from patient returning RN CM M c call. Patient continues to reside in his home along with brother. He is independent with ADLS/IADLs except he no longer drives. His family and friends take him to appts. 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 Denies any recent falls. Appetite good. Wgt stable. He reports he is monitoring BP in the home and BP readings have been good. Denies any RN CM needs or concerns at this time.    Medications Reviewed Today     Reviewed by Hayden Pedro, RN (Registered Nurse) on 10/09/21 at 1615  Med List Status: <None>   Medication Order Taking? Sig Documenting Provider Last Dose Status Informant  amLODipine (NORVASC) 5 MG tablet [id number] No Take 5 mg by mouth daily. [provider] Taking Active Self  atorvastatin (LIPITOR) 10 MG tablet [id number] No Take 10 mg by mouth daily. [provider] Taking Active Self  hydrocortisone (ANUSOL-HC) 2.5 % rectal cream [id number] No Place rectally 3 (three) times daily. X 2 weeks  Patient not taking: Reported on 07/23/2021   Orson Eva, MD Not Taking Active   losartan-hydrochlorothiazide [facility]) 100-25 MG tablet [id number] No Take 1 tablet by mouth daily. [provider] Taking Active   sertraline (ZOLOFT) 25 MG tablet [id number] No Take 25 mg by mouth daily. [provider] Taking Active   traMADol (ULTRAM) 50 MG tablet [id number] No Take 1 tablet (50 mg total) by mouth every 6 (six) hours as needed.  Patient not taking: Reported on 07/23/2021   Kem Parkinson, PA-C Not Taking Active                02/09/2021    8:00 AM 03/01/2021    9:49 PM 03/04/2021   10:33 AM 07/08/2021    9:11 AM 10/09/2021    4:15 PM  Fall Risk  Falls in the past year?   0  0  Was there an injury with Fall?   0  0  Fall Risk Category  Calculator   0  0  Fall Risk Category   Low  Low  Patient Fall Risk Level Low fall risk Low fall risk Low fall risk Low fall risk Low fall risk  Patient at Risk for Falls Due to   Medication side effect  Medication side effect  Fall risk Follow up   Falls evaluation completed  Falls evaluation completed      10/09/2021    4:28 PM 10/19/2020   10:42 AM  Depression screen PHQ 2/9  Decreased Interest 0 0  Down, Depressed, Hopeless 0 0  PHQ - 2 Score 0 0    SDOH Screenings   Alcohol Screen: Not on file  Depression (PHQ2-9): Low Risk    PHQ-2 Score: 0  Financial Resource Strain: Not on file  Food Insecurity: No Food Insecurity   Worried About Charity fundraiser in the Last Year: Never true   Ran Out of Food in the Last Year: Never true  Housing: Not on file  Physical Activity: Not on file  Social Connections: Not on file  Stress: Not on file  Tobacco Use: Medium Risk   Smoking Tobacco Use: Former   Smokeless Tobacco Use: Never   Passive Exposure: Not on file  Transportation Needs: No Transportation Needs   Lack of Transportation (Medical): No   Lack of Transportation (Non-Medical): No  Care Plan : Cancer Posttreatment Phase (Adult)  Updates made by Hayden Pedro, RN since 10/09/2021 12:00 AM     Problem: Disease Recurrence      Long-Range Goal: Disease Recurrence Monitored-Patient will report no active signs of cancer over the next 6 months Completed 10/09/2021  Start Date: 10/19/2020  Expected End Date: 05/17/2021  Recent Progress: On track  Priority: High  Note:    Notes:  10/19/2020 RN CM assessed current CA status and recent scan results. RN CM confirmed pt has MD follow up appt in place.  12/18/20 RN CM assessed fr any changes in condition. RN CM reviewed action plan with pt. RN CM confirmed routine preventive exams/test in place.  03/04/21 RN CM assessed for any changes in condition. RN CM discussed recent colonoscopy and results. RN CM confirmed  pt has MD f/u appts in place. 6/78/93-[id number] due to duplicate care plan and goals     Care Plan : RN Care Manager POC  Updates made by Hayden Pedro, RN since 10/09/2021 12:00 AM     Problem: Chronic Disease Mgmt of Chronic Condition-HTN   Priority: High     Long-Range Goal: Development of POC for Mgmt of Chronic Condition-HTN   Start Date: 10/09/2021  Expected End Date: 10/10/2022  Priority: High  Note:   Current Barriers:  Chronic Disease Management support and education needs related to HTN   RNCM Clinical Goal(s):  Patient will take all medications exactly as prescribed and will call provider for medication related questions as evidenced by mgmt of chronic conditions demonstrate Ongoing health management independence as evidenced by BP WNL parameters continue to work with RN Care Manager to address care management and care coordination needs related to  HTN as evidenced by adherence to CM Team Scheduled appointments through collaboration with RN Care manager, provider, and care team.   Interventions: POC sent to PCP upon initial assessment, quarterly and with any changes in patient's conditions Inter-disciplinary care team collaboration (see longitudinal plan of care) Evaluation of current treatment plan related to  self management and patient's adherence to plan as established by provider   Hypertension Interventions:  (Status:  New goal.) Long Term Goal Last practice recorded BP readings:  BP Readings from Last 3 Encounters:  07/23/21 (!) 157/82  07/08/21 123/76  03/01/21 (!) 167/93  Most recent eGFR/CrCl: No results found for: EGFR  No components found for: CRCL  Evaluation of current treatment plan related to hypertension self management and patient's adherence to plan as established by provider Provided education to patient re: stroke prevention, s/s of heart attack and stroke Reviewed medications with patient and discussed importance of  compliance Discussed plans with patient for ongoing care management follow up and provided patient with direct contact information for care management team Advised patient, providing education and rationale, to monitor blood pressure daily and record, calling PCP for findings outside established parameters Reviewed scheduled/upcoming provider appointments including:  Provided education on prescribed diet -low salt  Patient Goals/Self-Care Activities: Take all medications as prescribed Attend all scheduled provider appointments Call provider office for new concerns or questions  check blood pressure 3 times per week choose a place to take my blood pressure (home, clinic or office, retail store) write blood pressure results in a log or diary take blood pressure log to all doctor appointments call doctor for signs and symptoms of high blood pressure keep all doctor appointments eat more whole grains, fruits and vegetables, lean meats and healthy fats  Follow Up  Plan:  Telephone follow up appointment with care management team member scheduled for:  within the month of Aug The patient has been provided with contact information for the care management team and has been advised to call with any health related questions or concerns.        Plan: RN CM discussed with patient next outreach within the month of Aug. Patient agrees to care plan and follow up. RN CM will send quarterly update to PCP.   Enzo Montgomery, RN,BSN,CCM [place] Management Telephonic Care Management Coordinator Direct Phone: [phone] Toll Free: [phone] Fax: [phone]

## 2021-10-28 DIAGNOSIS — F419 Anxiety disorder, unspecified: Secondary | ICD-10-CM | POA: Diagnosis not present

## 2021-10-28 DIAGNOSIS — Z1389 Encounter for screening for other disorder: Secondary | ICD-10-CM | POA: Diagnosis not present

## 2021-10-28 DIAGNOSIS — Z1331 Encounter for screening for depression: Secondary | ICD-10-CM | POA: Diagnosis not present

## 2021-10-28 DIAGNOSIS — Z23 Encounter for immunization: Secondary | ICD-10-CM | POA: Diagnosis not present

## 2021-10-28 DIAGNOSIS — E785 Hyperlipidemia, unspecified: Secondary | ICD-10-CM | POA: Diagnosis not present

## 2021-10-28 DIAGNOSIS — Z0001 Encounter for general adult medical examination with abnormal findings: Secondary | ICD-10-CM | POA: Diagnosis not present

## 2021-10-28 DIAGNOSIS — I10 Essential (primary) hypertension: Secondary | ICD-10-CM | POA: Diagnosis not present

## 2021-10-30 DIAGNOSIS — E785 Hyperlipidemia, unspecified: Secondary | ICD-10-CM | POA: Diagnosis not present

## 2021-10-30 DIAGNOSIS — Z0001 Encounter for general adult medical examination with abnormal findings: Secondary | ICD-10-CM | POA: Diagnosis not present

## 2021-10-30 DIAGNOSIS — I10 Essential (primary) hypertension: Secondary | ICD-10-CM | POA: Diagnosis not present

## 2021-10-30 DIAGNOSIS — Z1159 Encounter for screening for other viral diseases: Secondary | ICD-10-CM | POA: Diagnosis not present

## 2021-11-26 ENCOUNTER — Ambulatory Visit: Payer: Self-pay

## 2021-11-27 DIAGNOSIS — E785 Hyperlipidemia, unspecified: Secondary | ICD-10-CM | POA: Diagnosis not present

## 2021-11-27 DIAGNOSIS — I10 Essential (primary) hypertension: Secondary | ICD-10-CM | POA: Diagnosis not present

## 2021-12-28 DIAGNOSIS — I10 Essential (primary) hypertension: Secondary | ICD-10-CM | POA: Diagnosis not present

## 2021-12-28 DIAGNOSIS — E782 Mixed hyperlipidemia: Secondary | ICD-10-CM | POA: Diagnosis not present

## 2021-12-30 ENCOUNTER — Other Ambulatory Visit: Payer: Self-pay

## 2021-12-30 NOTE — Patient Outreach (Signed)
[place] [facility]) Care Management  12/30/2021  Kent Smith 08/02/1950 [id number]   Telephone Assessment  Successful outreach call placed to patient. He states that he had COVID infection a few ago but with very minimal/mild sxs. He has been vaccinated with all COVID-vaccines an booster. [Patient stets he only had headache and mild chills. He took home test. He has since taken four other home test and they are all negative. He continues to work and remains very active. Medical conditions stable at present. Discussed case closure with patient. He is in agreement and voices understanding.       Plan: RN CM will close case.  Enzo Montgomery, RN,BSN,CCM [place] Management Telephonic Care Management Coordinator Direct Phone: [phone] Toll Free: [phone] Fax: [phone]

## 2022-01-01 ENCOUNTER — Ambulatory Visit: Payer: Self-pay

## 2022-01-21 ENCOUNTER — Encounter: Payer: Self-pay | Admitting: Urology

## 2022-01-21 ENCOUNTER — Ambulatory Visit (INDEPENDENT_AMBULATORY_CARE_PROVIDER_SITE_OTHER): Payer: Medicare PPO | Admitting: Urology

## 2022-01-21 VITALS — BP 145/81 | HR 82

## 2022-01-21 DIAGNOSIS — Z8546 Personal history of malignant neoplasm of prostate: Secondary | ICD-10-CM

## 2022-01-21 LAB — URINALYSIS, ROUTINE W REFLEX MICROSCOPIC
Bilirubin, UA: NEGATIVE
Glucose, UA: NEGATIVE
Ketones, UA: NEGATIVE
Leukocytes,UA: NEGATIVE
Nitrite, UA: NEGATIVE
Protein,UA: NEGATIVE
RBC, UA: NEGATIVE
Specific Gravity, UA: 1.02 (ref 1.005–1.030)
Urobilinogen, Ur: 0.2 mg/dL (ref 0.2–1.0)
pH, UA: 7 (ref 5.0–7.5)

## 2022-01-21 NOTE — Progress Notes (Signed)
History of Present Illness:   8.31.2021: TRUS/Bx.  At that time, PSA 5.3, prostate volume 27 mL, PSA density 0.20.   2/12 cores revealed adenocarcinoma-left mid medial core GS 3+4 and 40% of core, left apex medial, GS 3+4 in 40% of sample.   2.11.2022: He underwent I-125 brachytherapy/SpaceOAR placement  9.5.2023: No blood in urine or stool. IPSS 8  QoL score 2.  No specific urologic concerns.  Not on any prostate medications.  Past Medical History:  Diagnosis Date   History of colitis 11/2019   ED visit in epic   Hyperlipidemia    Hypertension    followed by pcp   Prostate cancer [facility]) urologist--- dr Kaylene Dawn/  oncologist--- dr Tammi Klippel   dx 08/ 2021,  Stage T1c, Gleason 3+4   Wears glasses     Past Surgical History:  Procedure Laterality Date   COLONOSCOPY WITH PROPOFOL N/A 01/07/2021   Procedure: COLONOSCOPY WITH PROPOFOL;  Surgeon: Eloise Harman, DO;  [facility];  Service: Endoscopy;  Laterality: N/A;  11:45am   CYSTOSCOPY  06/29/2020   Procedure: CYSTOSCOPY FLEXIBLE;  Surgeon: Franchot Gallo, MD;  Location: [facility];  Service: Urology;;  no seeds found in bladder   INGUINAL HERNIA REPAIR Left 10-21-2002  '@[facility]'$    POLYPECTOMY  01/07/2021   Procedure: POLYPECTOMY;  Surgeon: Eloise Harman, DO;  [facility];  Service: Endoscopy;;   PROSTATE BIOPSY  08/ 2021 in urologist office   RADIOACTIVE SEED IMPLANT N/A 06/29/2020   Procedure: RADIOACTIVE SEED IMPLANT/BRACHYTHERAPY IMPLANT;  Surgeon: Franchot Gallo, MD;  Location: [facility];  Service: Urology;  Laterality: N/A;    68  seeds implanted   SPACE OAR INSTILLATION N/A 06/29/2020   Procedure: SPACE OAR INSTILLATION;  Surgeon: Franchot Gallo, MD;  Location: [facility];  Service: Urology;  Laterality: N/A;    Home Medications:  Allergies as of 01/21/2022   No Known Allergies      Medication List        Accurate as of January 21, 2022   9:21 AM. If you have any questions, ask your nurse or doctor.          amLODipine 5 MG tablet Commonly known as: NORVASC Take 5 mg by mouth daily.   atorvastatin 10 MG tablet Commonly known as: LIPITOR Take 10 mg by mouth daily.   hydrocortisone 2.5 % rectal cream Commonly known as: ANUSOL-HC Place rectally 3 (three) times daily. X 2 weeks   losartan-hydrochlorothiazide 100-25 MG tablet Commonly known as: HYZAAR Take 1 tablet by mouth daily.   sertraline 25 MG tablet Commonly known as: ZOLOFT Take 25 mg by mouth daily.   traMADol 50 MG tablet Commonly known as: ULTRAM Take 1 tablet (50 mg total) by mouth every 6 (six) hours as needed.        Allergies: No Known Allergies  Family History  Problem Relation Age of Onset   Hypertension Father    CAD Father    Cancer Father        type unknown   Prostate cancer Neg Hx    Colon cancer Neg Hx    Pancreatic cancer Neg Hx    Breast cancer Neg Hx     Social History:  reports that he quit smoking about 35 years ago. His smoking use included cigarettes. He has a 18.00 pack-year smoking history. He has never used smokeless tobacco. He reports current alcohol use. He reports current drug use. Drug: Marijuana.  ROS: A complete review of systems was performed.  All systems are negative except for pertinent findings as noted.  Physical Exam:  Vital signs in last 24 hours: There were no vitals taken for this visit. Constitutional:  Alert and oriented, No acute distress Cardiovascular: Regular rate  Respiratory: Normal respiratory effort GI: Abdomen obese.  No inguinal hernias Genitourinary: Phallus uncircumcised.  No lesions.  Scrotal exam normal. Lymphatic: No lymphadenopathy Neurologic: Grossly intact, no focal deficits Psychiatric: Normal mood and affect  I have reviewed prior pt notes  I have reviewed urinalysis results  I have independently reviewed prior imaging--prostate U/S  I have reviewed prior PSA and  pathology results    Impression/Assessment:  Grade group 2 prostate cancer, status post I-125 brachytherapy/SpaceOAR in February, 2022.  So far, good PSA response without significant radiation related toxicity.  Plan:  PSA checked today  I'll see back in 6 mos for recheck

## 2022-01-22 LAB — PSA: Prostate Specific Ag, Serum: 0.7 ng/mL (ref 0.0–4.0)

## 2022-01-28 ENCOUNTER — Ambulatory Visit: Payer: No Typology Code available for payment source | Admitting: Urology

## 2022-01-28 DIAGNOSIS — E785 Hyperlipidemia, unspecified: Secondary | ICD-10-CM | POA: Diagnosis not present

## 2022-01-28 DIAGNOSIS — I10 Essential (primary) hypertension: Secondary | ICD-10-CM | POA: Diagnosis not present

## 2022-02-27 DIAGNOSIS — I10 Essential (primary) hypertension: Secondary | ICD-10-CM | POA: Diagnosis not present

## 2022-02-27 DIAGNOSIS — C61 Malignant neoplasm of prostate: Secondary | ICD-10-CM | POA: Diagnosis not present

## 2022-03-30 DIAGNOSIS — I10 Essential (primary) hypertension: Secondary | ICD-10-CM | POA: Diagnosis not present

## 2022-03-30 DIAGNOSIS — E785 Hyperlipidemia, unspecified: Secondary | ICD-10-CM | POA: Diagnosis not present

## 2022-04-29 DIAGNOSIS — I10 Essential (primary) hypertension: Secondary | ICD-10-CM | POA: Diagnosis not present

## 2022-04-29 DIAGNOSIS — E785 Hyperlipidemia, unspecified: Secondary | ICD-10-CM | POA: Diagnosis not present

## 2022-06-10 DIAGNOSIS — I10 Essential (primary) hypertension: Secondary | ICD-10-CM | POA: Diagnosis not present

## 2022-06-10 DIAGNOSIS — E785 Hyperlipidemia, unspecified: Secondary | ICD-10-CM | POA: Diagnosis not present

## 2022-06-10 DIAGNOSIS — F419 Anxiety disorder, unspecified: Secondary | ICD-10-CM | POA: Diagnosis not present

## 2022-06-10 DIAGNOSIS — Z6830 Body mass index (BMI) 30.0-30.9, adult: Secondary | ICD-10-CM | POA: Diagnosis not present

## 2022-07-11 DIAGNOSIS — I10 Essential (primary) hypertension: Secondary | ICD-10-CM | POA: Diagnosis not present

## 2022-07-11 DIAGNOSIS — E785 Hyperlipidemia, unspecified: Secondary | ICD-10-CM | POA: Diagnosis not present

## 2022-07-21 NOTE — Progress Notes (Signed)
History of Present Illness:   8.31.2021: TRUS/Bx.  At that time, PSA 5.3, prostate volume 27 mL, PSA density 0.20.   2/12 cores revealed adenocarcinoma-left mid medial core GS 3+4 and 40% of core, left apex medial, GS 3+4 in 40% of sample.   2.11.2022: He underwent I-125 brachytherapy/SpaceOAR placement  3.5.2024: No LUTS since last visit. No blood in urine or stool. IPSS 16, quality-of-life score 2.  He is not on any prostate medications.  Past Medical History:  Diagnosis Date   History of colitis 11/2019   ED visit in epic   Hyperlipidemia    Hypertension    followed by pcp   Prostate cancer [facility]) urologist--- dr Jaedin Regina/  oncologist--- dr Tammi Klippel   dx 08/ 2021,  Stage T1c, Gleason 3+4   Wears glasses     Past Surgical History:  Procedure Laterality Date   COLONOSCOPY WITH PROPOFOL N/A 01/07/2021   Procedure: COLONOSCOPY WITH PROPOFOL;  Surgeon: Eloise Harman, DO;  [facility];  Service: Endoscopy;  Laterality: N/A;  11:45am   CYSTOSCOPY  06/29/2020   Procedure: CYSTOSCOPY FLEXIBLE;  Surgeon: Franchot Gallo, MD;  Location: [facility];  Service: Urology;;  no seeds found in bladder   INGUINAL HERNIA REPAIR Left 10-21-2002  '@[facility]'$    POLYPECTOMY  01/07/2021   Procedure: POLYPECTOMY;  Surgeon: Eloise Harman, DO;  [facility];  Service: Endoscopy;;   PROSTATE BIOPSY  08/ 2021 in urologist office   RADIOACTIVE SEED IMPLANT N/A 06/29/2020   Procedure: RADIOACTIVE SEED IMPLANT/BRACHYTHERAPY IMPLANT;  Surgeon: Franchot Gallo, MD;  Location: [facility];  Service: Urology;  Laterality: N/A;    68  seeds implanted   SPACE OAR INSTILLATION N/A 06/29/2020   Procedure: SPACE OAR INSTILLATION;  Surgeon: Franchot Gallo, MD;  Location: [facility];  Service: Urology;  Laterality: N/A;    Home Medications:  Allergies as of 07/22/2022   No Known Allergies      Medication List        Accurate as of July 21, 2022  7:10 PM. If you have any questions, ask your nurse or doctor.          amLODipine 5 MG tablet Commonly known as: NORVASC Take 5 mg by mouth daily.   atorvastatin 10 MG tablet Commonly known as: LIPITOR Take 10 mg by mouth daily.   hydrocortisone 2.5 % rectal cream Commonly known as: ANUSOL-HC Place rectally 3 (three) times daily. X 2 weeks   losartan-hydrochlorothiazide 100-25 MG tablet Commonly known as: HYZAAR Take 1 tablet by mouth daily.   sertraline 25 MG tablet Commonly known as: ZOLOFT Take 25 mg by mouth daily.   traMADol 50 MG tablet Commonly known as: ULTRAM Take 1 tablet (50 mg total) by mouth every 6 (six) hours as needed.        Allergies: No Known Allergies  Family History  Problem Relation Age of Onset   Hypertension Father    CAD Father    Cancer Father        type unknown   Prostate cancer Neg Hx    Colon cancer Neg Hx    Pancreatic cancer Neg Hx    Breast cancer Neg Hx     Social History:  reports that he quit smoking about 36 years ago. His smoking use included cigarettes. He has a 18.00 pack-year smoking history. He has never used smokeless tobacco. He reports current alcohol use. He reports current drug use. Drug: Marijuana.  ROS: A complete review of systems was performed.  All systems are negative except for pertinent findings as noted.  Physical Exam:  Vital signs in last 24 hours: There were no vitals taken for this visit. Constitutional:  Alert and oriented, No acute distress Cardiovascular: Regular rate  Respiratory: Normal respiratory effort Neurologic: Grossly intact, no focal deficits Psychiatric: Normal mood and affect  I have reviewed prior pt notes  I have reviewed urinalysis results--clear  I have independently reviewed prior imaging--U/S volume  I have reviewed prior PSA and path results  I have reviewed IPSS form   Impression/Assessment:  Grade group 2 prostate cancer, status post brachytherapy just  over 2 years ago with thus far satisfactory PSA results.  Stable lower urinary tract symptoms  Plan:  PSA is checked today  I will have him come back in 6 months for recheck

## 2022-07-22 ENCOUNTER — Encounter: Payer: Self-pay | Admitting: Urology

## 2022-07-22 ENCOUNTER — Ambulatory Visit (INDEPENDENT_AMBULATORY_CARE_PROVIDER_SITE_OTHER): Payer: Medicare HMO | Admitting: Urology

## 2022-07-22 VITALS — BP 138/83 | HR 83

## 2022-07-22 DIAGNOSIS — Z8546 Personal history of malignant neoplasm of prostate: Secondary | ICD-10-CM | POA: Diagnosis not present

## 2022-07-22 DIAGNOSIS — Z08 Encounter for follow-up examination after completed treatment for malignant neoplasm: Secondary | ICD-10-CM

## 2022-07-23 LAB — URINALYSIS, ROUTINE W REFLEX MICROSCOPIC
Bilirubin, UA: NEGATIVE
Glucose, UA: NEGATIVE
Ketones, UA: NEGATIVE
Leukocytes,UA: NEGATIVE
Nitrite, UA: NEGATIVE
Protein,UA: NEGATIVE
Specific Gravity, UA: 1.02 (ref 1.005–1.030)
Urobilinogen, Ur: 0.2 mg/dL (ref 0.2–1.0)
pH, UA: 5.5 (ref 5.0–7.5)

## 2022-07-23 LAB — MICROSCOPIC EXAMINATION: Bacteria, UA: NONE SEEN

## 2022-07-23 LAB — PSA: Prostate Specific Ag, Serum: 0.6 ng/mL (ref 0.0–4.0)

## 2022-08-03 ENCOUNTER — Emergency Department (HOSPITAL_COMMUNITY): Payer: No Typology Code available for payment source

## 2022-08-03 ENCOUNTER — Other Ambulatory Visit: Payer: Self-pay

## 2022-08-03 ENCOUNTER — Emergency Department (HOSPITAL_COMMUNITY)
Admission: EM | Admit: 2022-08-03 | Discharge: 2022-08-03 | Disposition: A | Discharge status: Home / Self Care | Payer: No Typology Code available for payment source | Attending: Emergency Medicine | Admitting: Emergency Medicine

## 2022-08-03 ENCOUNTER — Encounter (HOSPITAL_COMMUNITY): Payer: Self-pay | Admitting: Emergency Medicine

## 2022-08-03 DIAGNOSIS — S060X0A Concussion without loss of consciousness, initial encounter: Secondary | ICD-10-CM | POA: Diagnosis not present

## 2022-08-03 DIAGNOSIS — S0990XA Unspecified injury of head, initial encounter: Secondary | ICD-10-CM

## 2022-08-03 DIAGNOSIS — I10 Essential (primary) hypertension: Secondary | ICD-10-CM | POA: Insufficient documentation

## 2022-08-03 DIAGNOSIS — Z87891 Personal history of nicotine dependence: Secondary | ICD-10-CM | POA: Diagnosis not present

## 2022-08-03 DIAGNOSIS — Z79899 Other long term (current) drug therapy: Secondary | ICD-10-CM | POA: Diagnosis not present

## 2022-08-03 NOTE — ED Triage Notes (Signed)
Pt states he hit the top of his head getting out of a cab x1week ago. No LOC. C/o of headaches since then.

## 2022-08-03 NOTE — ED Provider Notes (Signed)
[facility] Provider Note   CSN: [id number] Arrival date & time: 08/03/22  0944     History  Chief Complaint  Patient presents with   Headache    Kent Smith is a 72 y.o. male.  Patient status post injury to the top of his head getting out of a cab on Monday or Tuesday.  Had a little abrasion there.  Had a persistent headache since.  No new neck pain.  Neck pain existing prior.  No nausea no vomiting no visual changes does state that he does feel off balance a little bit at times.  Mostly in the morning.  Denies any other injury.  Past medical history sniffing for hypertension prostate cancer.  Hyperlipidemia.  Patient is a former smoker quit in 1988.       Home Medications Prior to Admission medications   Medication Sig Start Date End Date Taking? Authorizing Provider  amLODipine (NORVASC) 5 MG tablet Take 5 mg by mouth daily. 09/09/19   [provider]  atorvastatin (LIPITOR) 10 MG tablet Take 10 mg by mouth daily. 10/28/19   [provider]  hydrocortisone (ANUSOL-HC) 2.5 % rectal cream Place rectally 3 (three) times daily. X 2 weeks 02/09/21   Tat, Shanon Brow, MD  losartan-hydrochlorothiazide (HYZAAR) 100-25 MG tablet Take 1 tablet by mouth daily. 05/24/21   [provider]  sertraline (ZOLOFT) 25 MG tablet Take 25 mg by mouth daily. 03/01/21   [provider]  traMADol (ULTRAM) 50 MG tablet Take 1 tablet (50 mg total) by mouth every 6 (six) hours as needed. 03/01/21   Triplett, Tammy, PA-C      Allergies    Patient has no known allergies.    Review of Systems   Review of Systems  Constitutional:  Negative for chills and fever.  HENT:  Negative for ear pain and sore throat.   Eyes:  Negative for pain and visual disturbance.  Respiratory:  Negative for cough and shortness of breath.   Cardiovascular:  Negative for chest pain and palpitations.  Gastrointestinal:  Negative for abdominal pain and  vomiting.  Genitourinary:  Negative for dysuria and hematuria.  Musculoskeletal:  Negative for arthralgias and back pain.  Skin:  Negative for color change and rash.  Neurological:  Positive for dizziness, light-headedness and headaches. Negative for seizures and syncope.  All other systems reviewed and are negative.   Physical Exam Updated Vital Signs BP (!) 153/72 (BP Location: Left Arm)   Pulse 86   Temp 98.3 F (36.8 C) (Oral)   Resp 16   Ht 1.727 m (5\' 8" )   Wt 89.4 kg   SpO2 99%   BMI 29.95 kg/m  Physical Exam Vitals and nursing note reviewed.  Constitutional:      General: He is not in acute distress.    Appearance: Normal appearance. He is well-developed. He is not ill-appearing or toxic-appearing.  HENT:     Head: Normocephalic.     Comments: 5 mm abrasion to the top of his scalp left side.  Well-healed.  No signs of secondary infection.    Mouth/Throat:     Mouth: Mucous membranes are moist.  Eyes:     Extraocular Movements: Extraocular movements intact.     Conjunctiva/sclera: Conjunctivae normal.     Pupils: Pupils are equal, round, and reactive to light.  Cardiovascular:     Rate and Rhythm: Normal rate and regular rhythm.     Heart sounds: No murmur  heard. Pulmonary:     Effort: Pulmonary effort is normal. No respiratory distress.     Breath sounds: Normal breath sounds.  Abdominal:     Palpations: Abdomen is soft.     Tenderness: There is no abdominal tenderness.  Musculoskeletal:        General: No swelling.     Cervical back: Neck supple. No rigidity.  Skin:    General: Skin is warm and dry.     Capillary Refill: Capillary refill takes less than 2 seconds.  Neurological:     General: No focal deficit present.     Mental Status: He is alert and oriented to person, place, and time.     Cranial Nerves: No cranial nerve deficit.     Sensory: No sensory deficit.     Motor: No weakness.     Coordination: Coordination normal.  Psychiatric:         Mood and Affect: Mood normal.     ED Results / Procedures / Treatments   Labs (all labs ordered are listed, but only abnormal results are displayed) Labs Reviewed - No data to display  EKG None  Radiology CT Head Wo Contrast  Result Date: 08/03/2022 CLINICAL DATA:  Head trauma EXAM: CT HEAD WITHOUT CONTRAST TECHNIQUE: Contiguous axial images were obtained from the base of the skull through the vertex without intravenous contrast. RADIATION DOSE REDUCTION: This exam was performed according to the departmental dose-optimization program which includes automated exposure control, adjustment of the mA and/or kV according to patient size and/or use of iterative reconstruction technique. COMPARISON:  None Available. FINDINGS: Brain: No acute intracranial hemorrhage. No focal mass lesion. No CT evidence of acute infarction. No midline shift or mass effect. No hydrocephalus. Basilar cisterns are patent. There are periventricular and subcortical white matter hypodensities. Generalized cortical atrophy. Vascular: No hyperdense vessel or unexpected calcification. Skull: Normal. Negative for fracture or focal lesion. Sinuses/Orbits: Paranasal sinuses and mastoid air cells are clear. Orbits are clear. Other: None. IMPRESSION: 1. No intracranial trauma. 2. Atrophy and white matter microvascular disease. Electronically Signed   By: Suzy Bouchard M.D.   On: 08/03/2022 11:01    Procedures Procedures    Medications Ordered in ED Medications - No data to display  ED Course/ Medical Decision Making/ A&P                             Medical Decision Making Amount and/or Complexity of Data Reviewed Radiology: ordered.   Patient symptoms clinically suggestive of a postconcussive syndrome.  Head CT ordered to rule out any intracranial injury.  Head CT is negative no skull fracture no intracranial traumatic injury.  Based on this patient will be given precautions about the concussion and follow-up with  his primary care doctor.   Final Clinical Impression(s) / ED Diagnoses Final diagnoses:  Injury of head, initial encounter  Concussion without loss of consciousness, initial encounter    Rx / DC Orders ED Discharge Orders     None         Fredia Sorrow, MD 08/03/22 1201

## 2022-08-03 NOTE — Discharge Instructions (Addendum)
Head CT negative no signs of any significant injury.  So therefore your symptoms are consistent with a concussion.  Follow instructions provided.  Make an appointment to follow-up with your primary care doctor.  Return for any new or worse symptoms.

## 2022-08-08 ENCOUNTER — Telehealth: Payer: Self-pay

## 2022-08-08 NOTE — Telephone Encounter (Signed)
     Patient  visit on 08/03/2022  at [facility] was for headache, injury of head.  Have you been able to follow up with your primary care physician? Yes  The patient was or was not able to obtain any needed medicine or equipment. No medication prescribed.  Are there diet recommendations that you are having difficulty following? No  Patient expresses understanding of discharge instructions and education provided has no other needs at this time. Yes   [facility] Resource Care Guide   [email or web address]  ?? [phone]   Website: [email or web address]  [email or web address]

## 2022-08-09 DIAGNOSIS — I10 Essential (primary) hypertension: Secondary | ICD-10-CM | POA: Diagnosis not present

## 2022-08-09 DIAGNOSIS — E785 Hyperlipidemia, unspecified: Secondary | ICD-10-CM | POA: Diagnosis not present

## 2022-08-24 ENCOUNTER — Emergency Department (HOSPITAL_COMMUNITY): Payer: No Typology Code available for payment source

## 2022-08-24 ENCOUNTER — Encounter (HOSPITAL_COMMUNITY): Payer: Self-pay

## 2022-08-24 ENCOUNTER — Other Ambulatory Visit: Payer: Self-pay

## 2022-08-24 ENCOUNTER — Emergency Department (HOSPITAL_COMMUNITY)
Admission: EM | Admit: 2022-08-24 | Discharge: 2022-08-24 | Disposition: A | Discharge status: Home / Self Care | Payer: No Typology Code available for payment source | Attending: Emergency Medicine | Admitting: Emergency Medicine

## 2022-08-24 DIAGNOSIS — I10 Essential (primary) hypertension: Secondary | ICD-10-CM | POA: Insufficient documentation

## 2022-08-24 DIAGNOSIS — Z8546 Personal history of malignant neoplasm of prostate: Secondary | ICD-10-CM | POA: Insufficient documentation

## 2022-08-24 DIAGNOSIS — Z79899 Other long term (current) drug therapy: Secondary | ICD-10-CM | POA: Insufficient documentation

## 2022-08-24 DIAGNOSIS — R519 Headache, unspecified: Secondary | ICD-10-CM | POA: Diagnosis present

## 2022-08-24 MED ORDER — IBUPROFEN 400 MG PO TABS
400.0000 mg | ORAL_TABLET | Freq: Once | ORAL | Status: AC
  Administered 2022-08-24: 400 mg via ORAL
  Filled 2022-08-24: qty 1

## 2022-08-24 NOTE — ED Provider Notes (Signed)
[place] EMERGENCY DEPARTMENT AT [facility] Provider Note   CSN: [id number] Arrival date & time: 08/24/22  1761     History  Chief Complaint  Patient presents with   Headache    Kent Smith is a 72 y.o. male.  HPI 72 year old male with a history of prostate cancer, hypertension, hyperlipidemia presents with headache.  He hit the top of his head on a cab when he was getting into the cab on 3/17.  He had a negative head CT at that time.  He has been dealing with intermittent headaches since, mostly on the top of his head.  However today at around 6 AM he developed a headache on the right side of his head.  No neck pain or stiffness, fever, or weakness/numbness.  No visual complaints.  Took some Tylenol and later some Goody powder with some partial relief.  Headaches about a 7.  Headache has gradually worsened since onset.  Home Medications Prior to Admission medications   Medication Sig Start Date End Date Taking? Authorizing Provider  amLODipine (NORVASC) 5 MG tablet Take 5 mg by mouth daily. 09/09/19   [provider]  atorvastatin (LIPITOR) 10 MG tablet Take 10 mg by mouth daily. 10/28/19   [provider]  hydrocortisone (ANUSOL-HC) 2.5 % rectal cream Place rectally 3 (three) times daily. X 2 weeks 02/09/21   Tat, Onalee Hua, MD  losartan-hydrochlorothiazide (HYZAAR) 100-25 MG tablet Take 1 tablet by mouth daily. 05/24/21   [provider]  sertraline (ZOLOFT) 25 MG tablet Take 25 mg by mouth daily. 03/01/21   [provider]  traMADol (ULTRAM) 50 MG tablet Take 1 tablet (50 mg total) by mouth every 6 (six) hours as needed. 03/01/21   Triplett, Tammy, PA-C      Allergies    Patient has no known allergies.    Review of Systems   Review of Systems  Constitutional:  Negative for fever.  Eyes:  Negative for visual disturbance.  Gastrointestinal:  Negative for vomiting.  Musculoskeletal:  Negative for neck pain and neck stiffness.   Neurological:  Positive for headaches. Negative for weakness and numbness.    Physical Exam Updated Vital Signs BP (!) 144/85   Pulse 81   Temp 98.1 F (36.7 C) (Oral)   Resp 17   Ht 5\' 8"  (1.727 m)   Wt 89.4 kg   SpO2 99%   BMI 29.95 kg/m  Physical Exam Vitals and nursing note reviewed.  Constitutional:      Appearance: He is well-developed.  HENT:     Head: Normocephalic and atraumatic.      Comments: No scalp tenderness or temple tenderness bilaterally Cardiovascular:     Rate and Rhythm: Normal rate and regular rhythm.     Heart sounds: Normal heart sounds.  Pulmonary:     Effort: Pulmonary effort is normal.     Breath sounds: Normal breath sounds.  Abdominal:     Palpations: Abdomen is soft.     Tenderness: There is no abdominal tenderness.  Musculoskeletal:     Cervical back: Normal range of motion and neck supple. No rigidity. No spinous process tenderness or muscular tenderness.  Skin:    General: Skin is warm and dry.  Neurological:     Mental Status: He is alert.     Comments: CN 3-12 grossly intact. 5/5 strength in all 4 extremities. Grossly normal sensation. Normal finger to nose.      ED Results / Procedures / Treatments  Labs (all labs ordered are listed, but only abnormal results are displayed) Labs Reviewed - No data to display  EKG None  Radiology CT Head Wo Contrast  Result Date: 08/24/2022 CLINICAL DATA:  Provided history: Headache, new onset. Additional history provided: Recent head trauma, headaches. EXAM: CT HEAD WITHOUT CONTRAST TECHNIQUE: Contiguous axial images were obtained from the base of the skull through the vertex without intravenous contrast. RADIATION DOSE REDUCTION: This exam was performed according to the departmental dose-optimization program which includes automated exposure control, adjustment of the mA and/or kV according to patient size and/or use of iterative reconstruction technique. COMPARISON:  Head CT 08/03/2022.  FINDINGS: Brain: No age advanced or lobar predominant parenchymal atrophy. Redemonstrated partially empty sella turcica, a nonspecific finding. There is no acute intracranial hemorrhage. No demarcated cortical infarct. No extra-axial fluid collection. No evidence of an intracranial mass. No midline shift. Vascular: No hyperdense vessel. Skull: No fracture or aggressive osseous lesion. Sinuses/Orbits: No mass or acute finding within the imaged orbits. No significant paranasal sinus disease at the imaged levels. IMPRESSION: No evidence of an acute intracranial abnormality. Electronically Signed   By: Jackey Loge D.O.   On: 08/24/2022 11:46    Procedures Procedures    Medications Ordered in ED Medications  ibuprofen (ADVIL) tablet 400 mg (400 mg Oral Given 08/24/22 1124)    ED Course/ Medical Decision Making/ A&P                             Medical Decision Making Amount and/or Complexity of Data Reviewed Radiology: ordered and independent interpretation performed.    Details: No head bleed/edema  Risk Prescription drug management.   Patient presents with a right sided headache. Was not thunderclap. No other concerning features besides age, but has been having on and off headaches since he hit his head. This is not at his temple, and I have low concern for temporal arteritis. Low suspicion for acute CNS emergency such as infection, stroke, etc. Ibuprofen resolved the headache.  Given his headache chronicity, will have him follow-up with neurology but otherwise he appears stable for discharge home with return precautions.        Final Clinical Impression(s) / ED Diagnoses Final diagnoses:  Right-sided headache    Rx / DC Orders ED Discharge Orders     None         Pricilla Loveless, MD 08/24/22 1310

## 2022-08-24 NOTE — Discharge Instructions (Signed)
If you develop continued, recurrent, or worsening headache, fever, neck stiffness, vomiting, blurry or double vision, weakness or numbness in your arms or legs, trouble speaking, or any other new/concerning symptoms then return to the ER for evaluation.  

## 2022-08-24 NOTE — ED Triage Notes (Signed)
Pt states he hit his head coming out of a cab a few weeks ago and has had headaches ever since. Pt states today it is much worse and will not go away after 650 mg of tylenol at 0650 hrs and goody powder at 0800 hrs. Pain is at his right temporal.

## 2022-08-24 NOTE — ED Notes (Signed)
Patient transported to CT 

## 2022-08-27 ENCOUNTER — Telehealth: Payer: Self-pay | Admitting: *Deleted

## 2022-08-27 NOTE — Telephone Encounter (Signed)
        Patient  visited [facility] on 08/23/2022  for treatment   Telephone encounter attempt :  1st  No answer   Alois Cliche -[facility] [place], Population Health [phone] [address] [place] , [place] [address] Email : [email or web address]

## 2022-09-09 DIAGNOSIS — E785 Hyperlipidemia, unspecified: Secondary | ICD-10-CM | POA: Diagnosis not present

## 2022-09-09 DIAGNOSIS — I10 Essential (primary) hypertension: Secondary | ICD-10-CM | POA: Diagnosis not present

## 2022-10-08 IMAGING — DX DG CHEST 2V
2 series · 2 of 2 positions shown · non-contrast
Comparison: None

CLINICAL DATA: A 70-year-old male presents for evaluation of chest
pain.

EXAM:
CHEST - 2 VIEW

[chest pa]
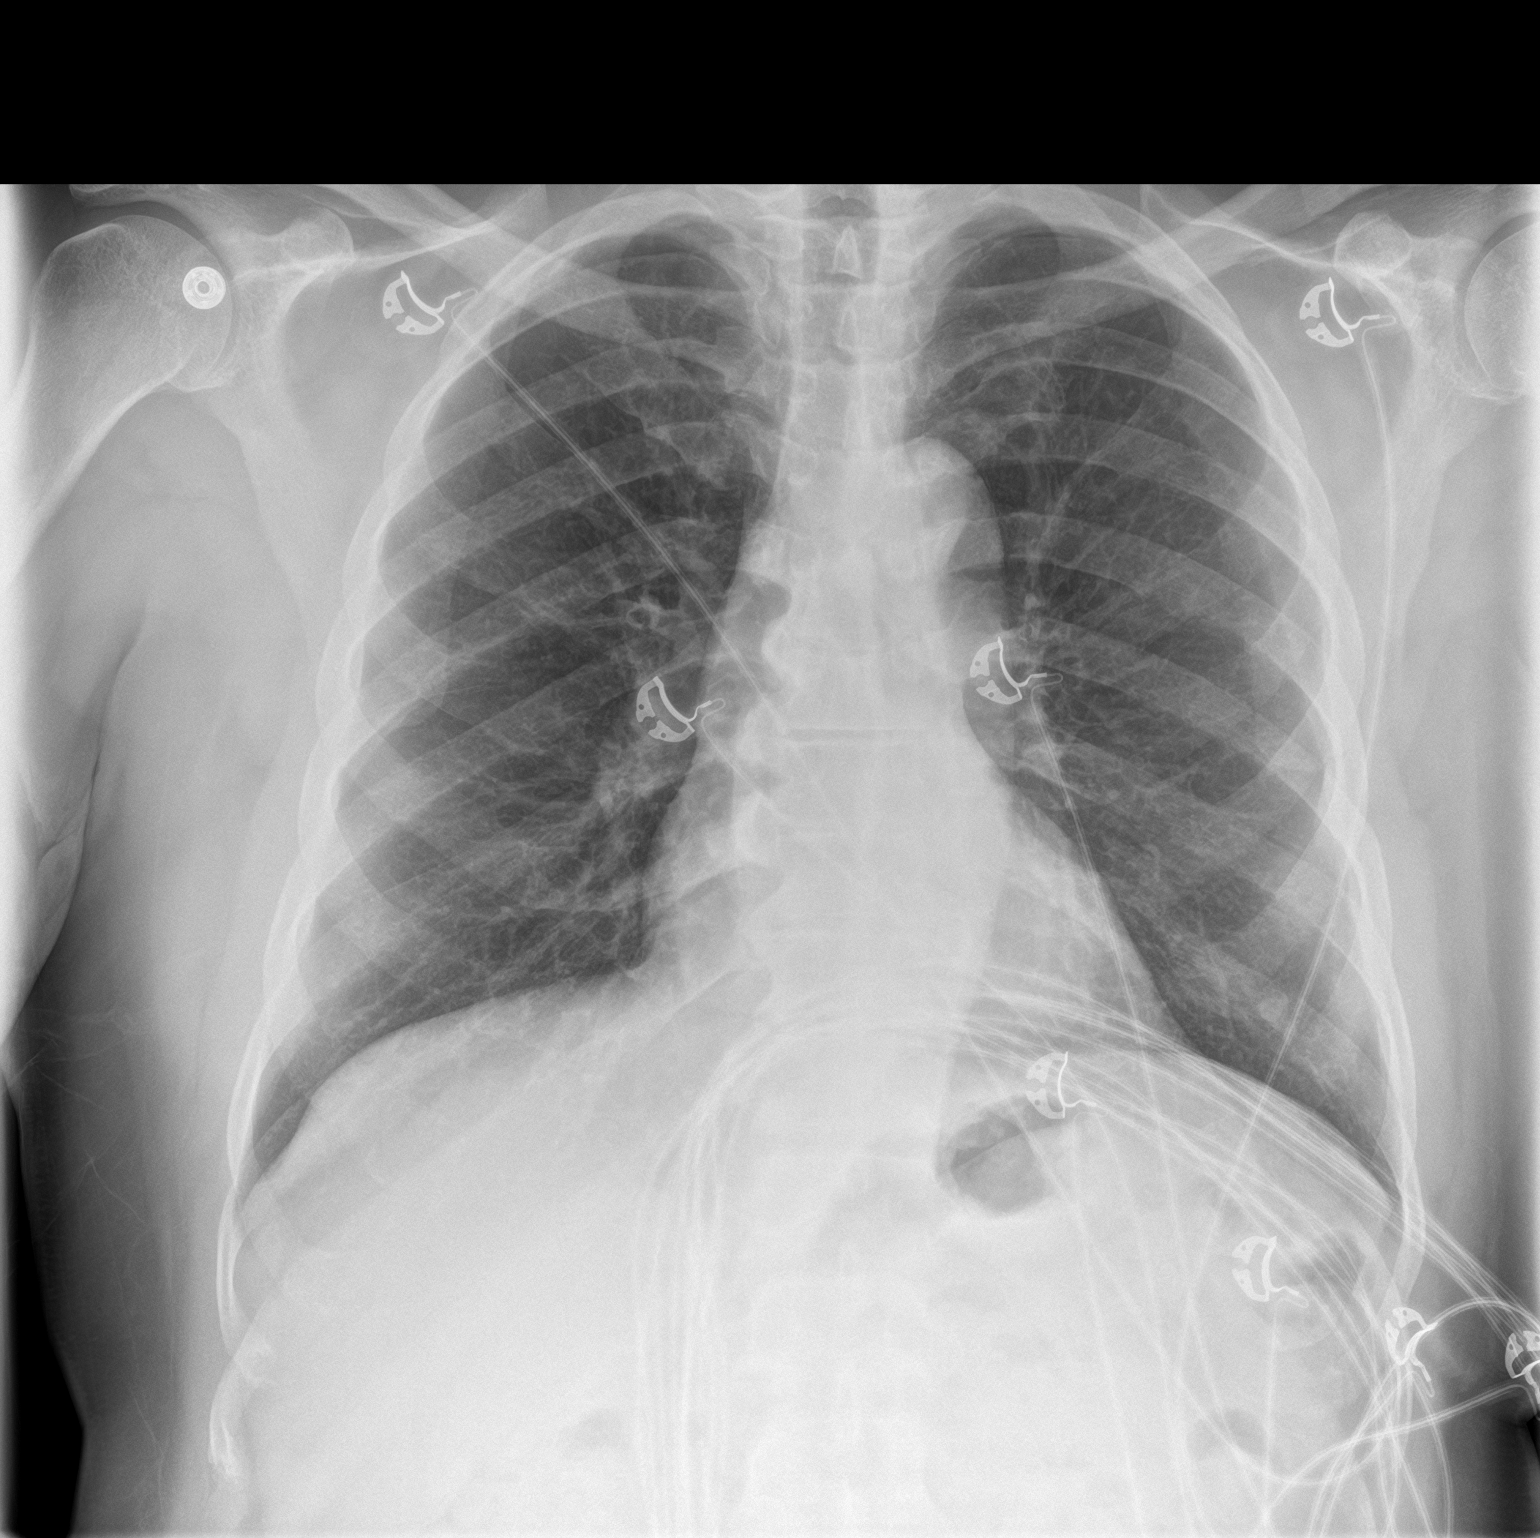

[chest lat]
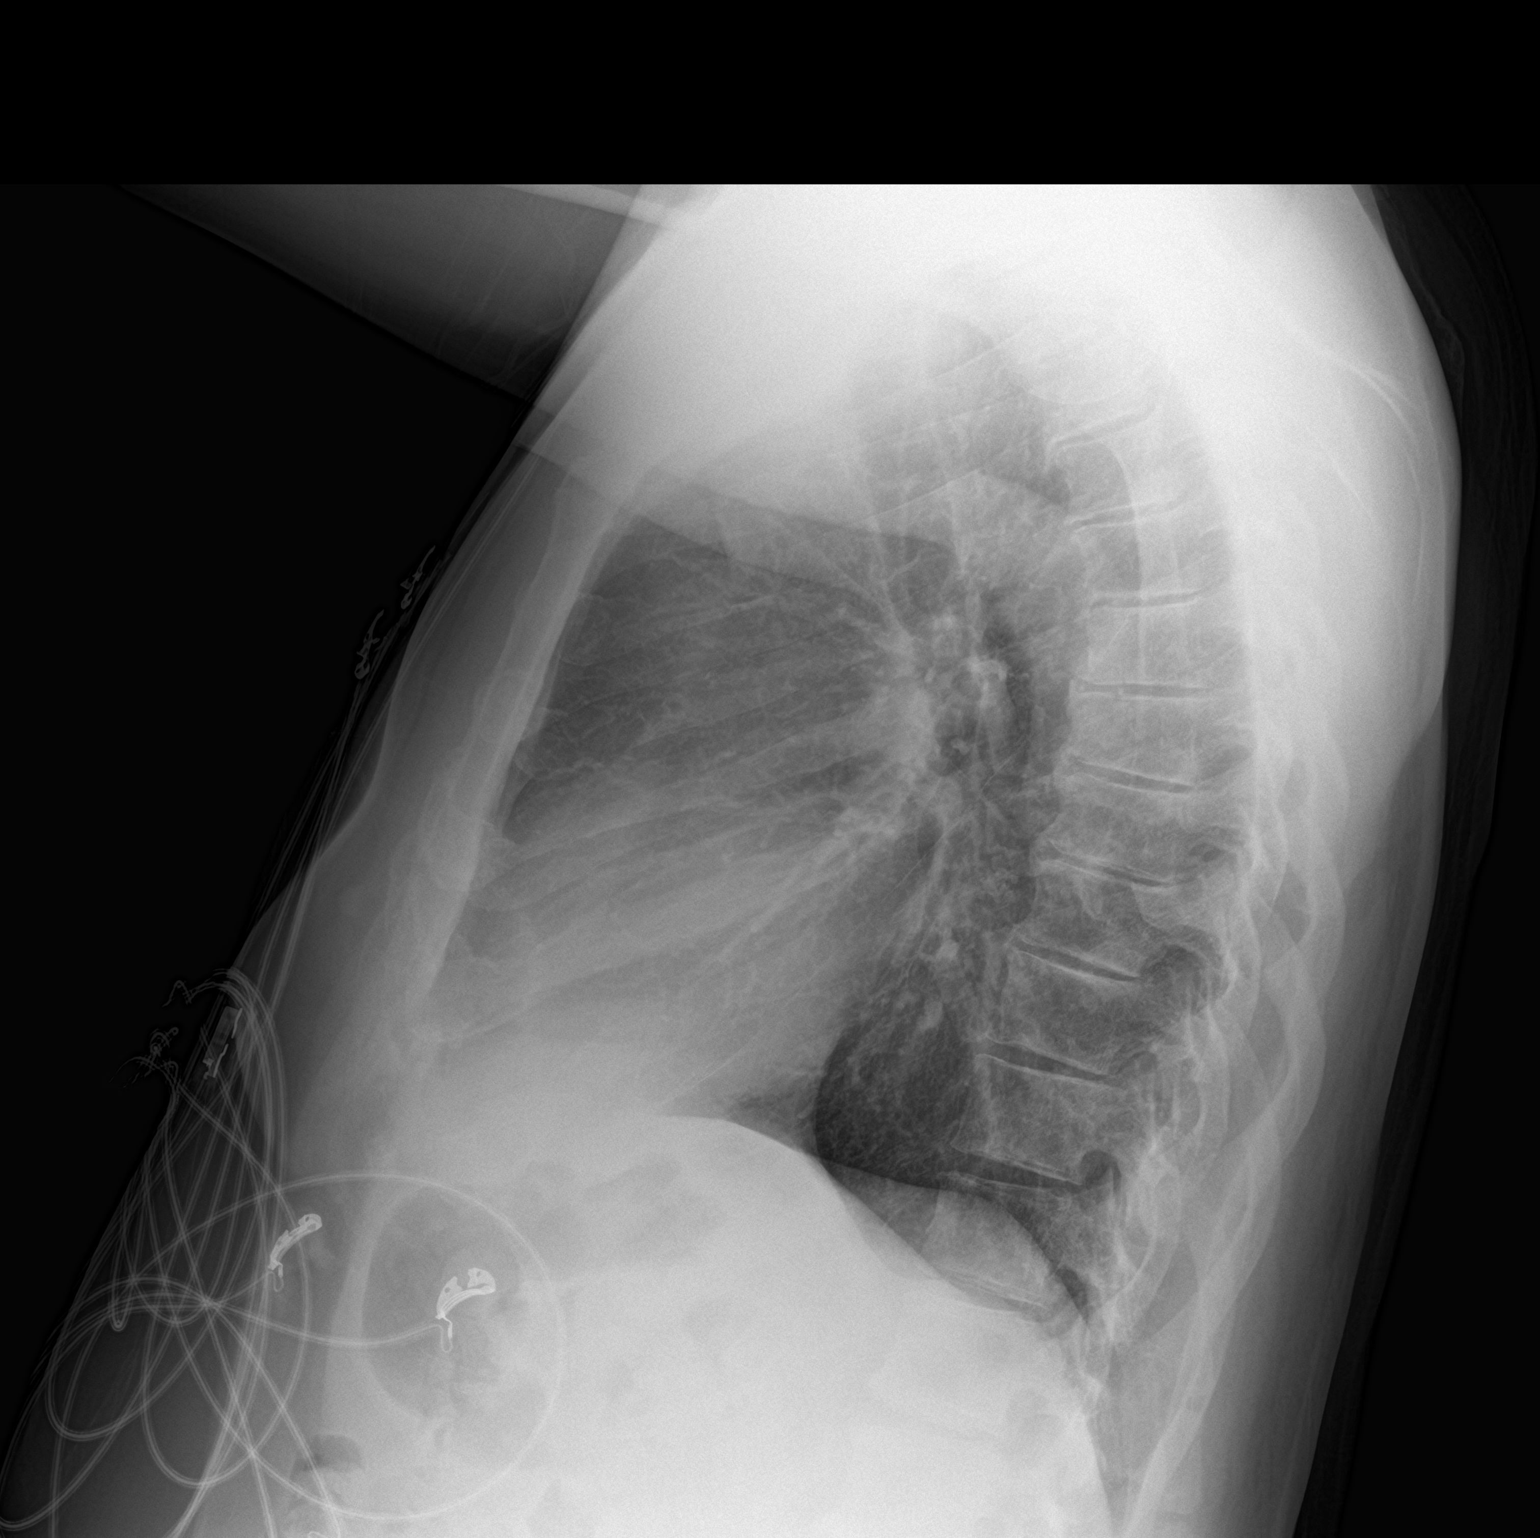

[2 of 2 positions shown; findings below may reference images not displayed]

FINDINGS: EKG leads project over the chest.

Cardiomediastinal contours and hilar structures are normal.

Lungs are clear.  No visible pneumothorax.

No signs of pleural effusion.

On limited assessment no acute skeletal findings.
IMPRESSION: No acute cardiopulmonary disease.

## 2022-10-08 IMAGING — US US SCROTUM W/ DOPPLER COMPLETE
1 series · 14 of 25 positions shown · non-contrast
Comparison: None

CLINICAL DATA: Pain in the RIGHT groin for 2-3 months

EXAM:
SCROTAL ULTRASOUND
DOPPLER ULTRASOUND OF THE TESTICLES
TECHNIQUE: Complete ultrasound examination of the testicles, epididymis, and
other scrotal structures was performed. Color and spectral Doppler
ultrasound were also utilized to evaluate blood flow to the
testicles.

[Series 1: us scrotum w/doppler · 14 of 59 slices shown]
[im 1/59]
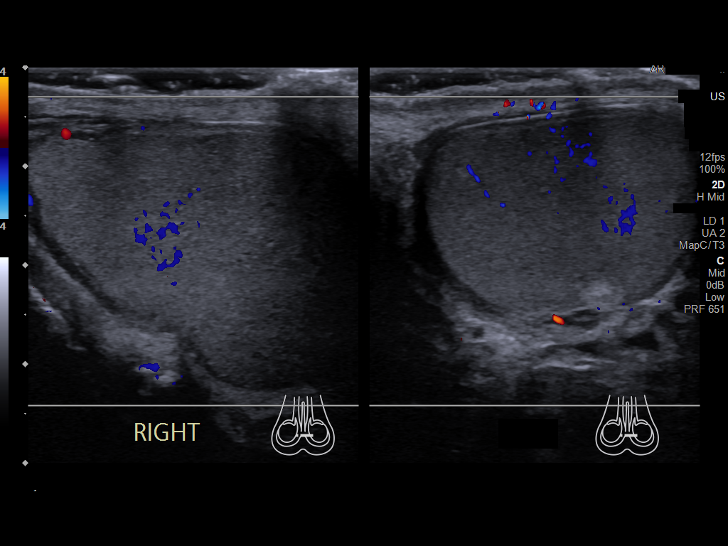
[im 5/59]
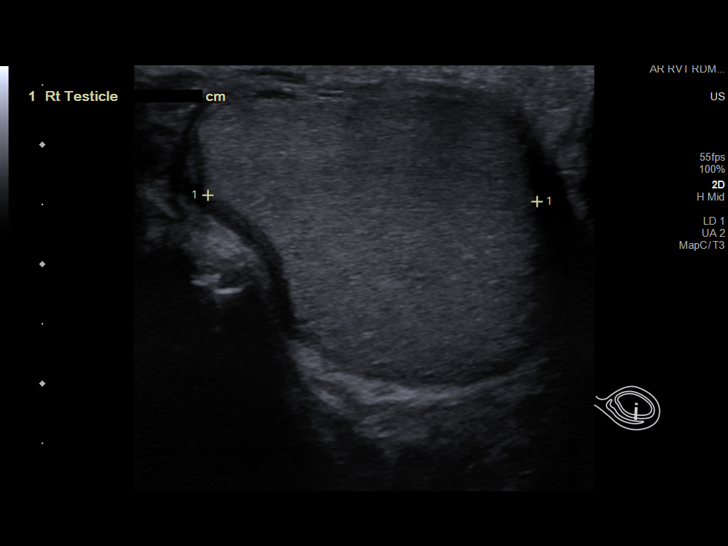
[im 10/59]
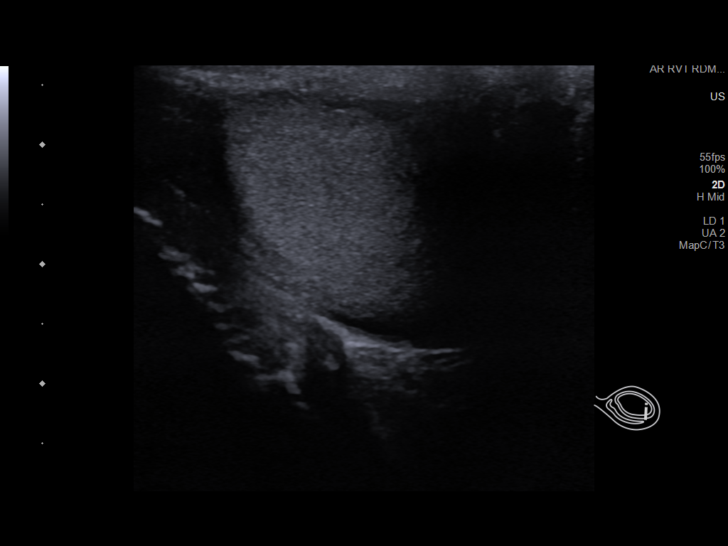
[im 15/59]
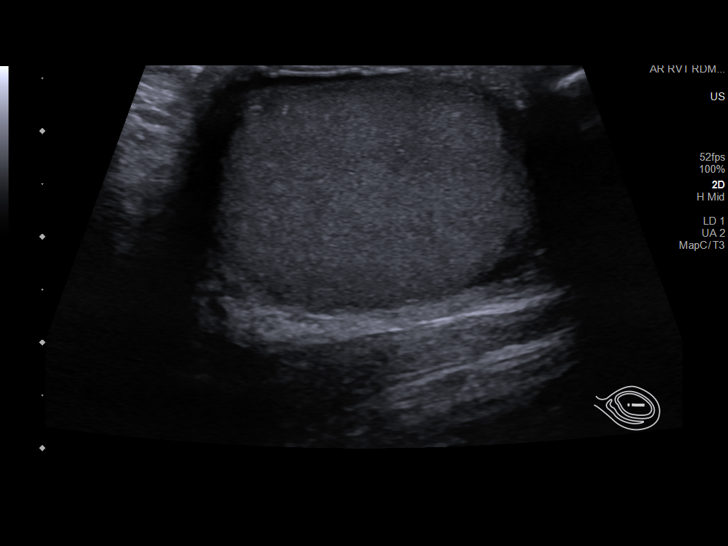
[im 20/59]
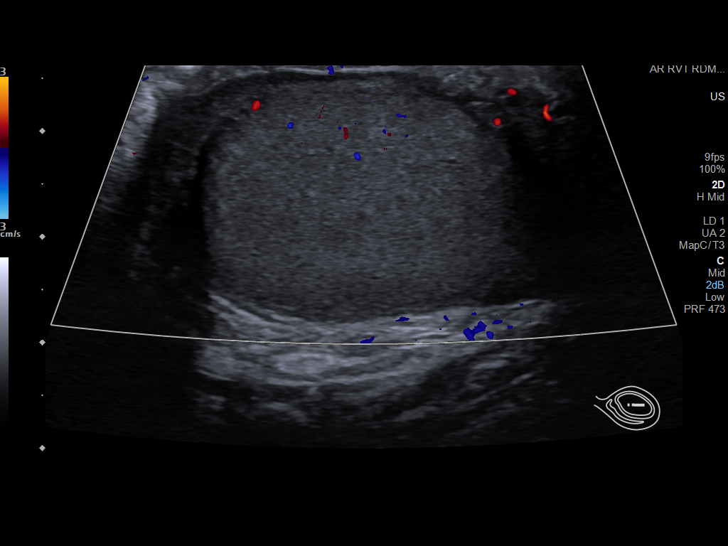
[im 22/59]
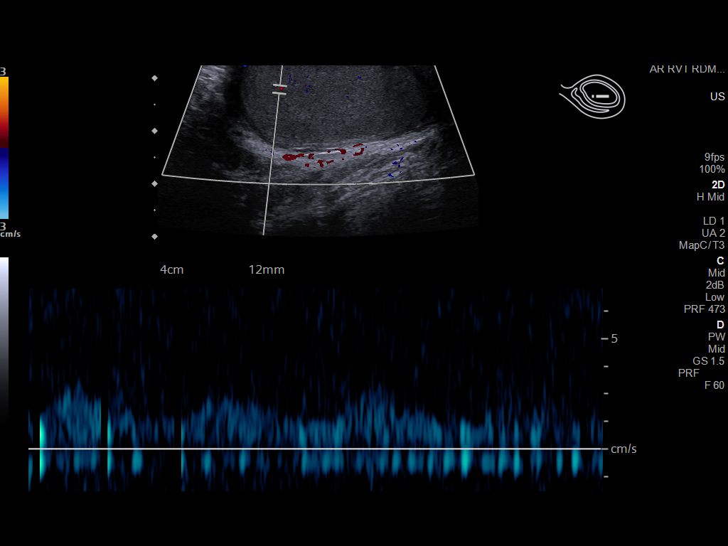
[im 27/59]
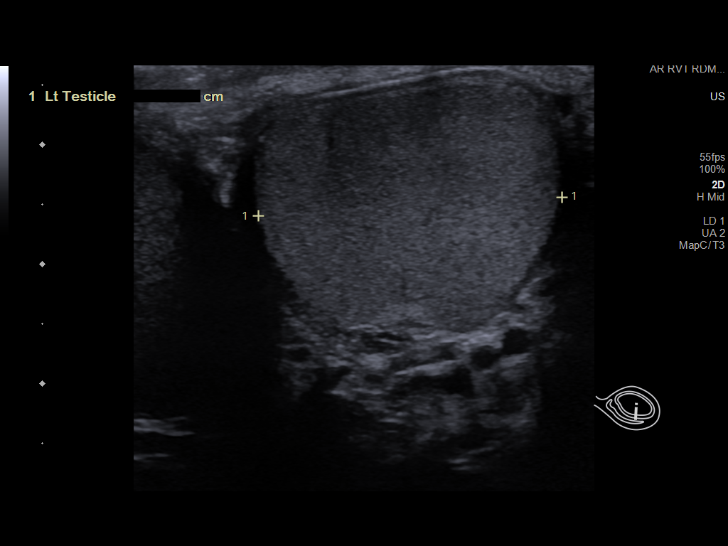
[im 32/59]
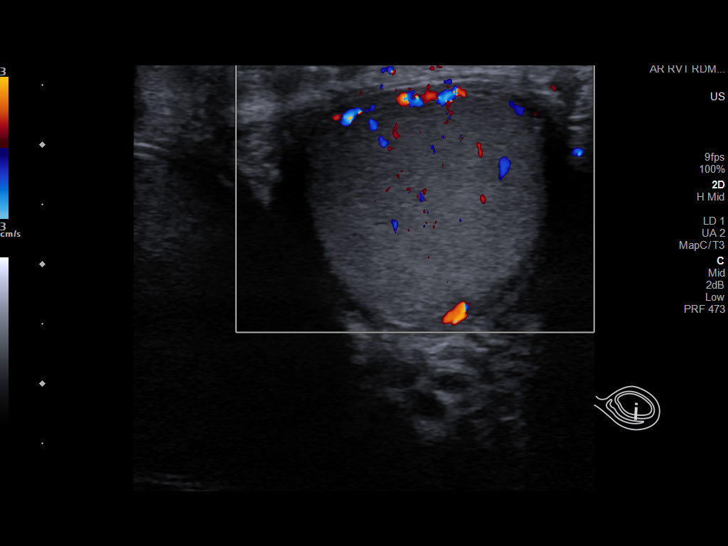
[im 37/59]
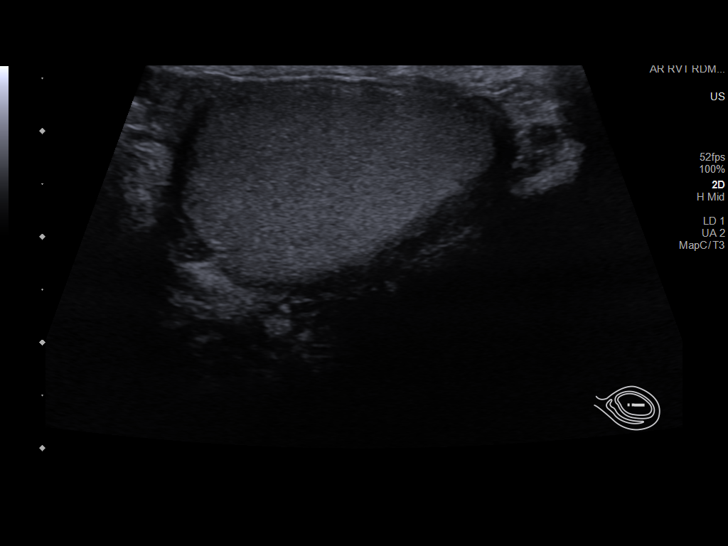
[im 39/59]
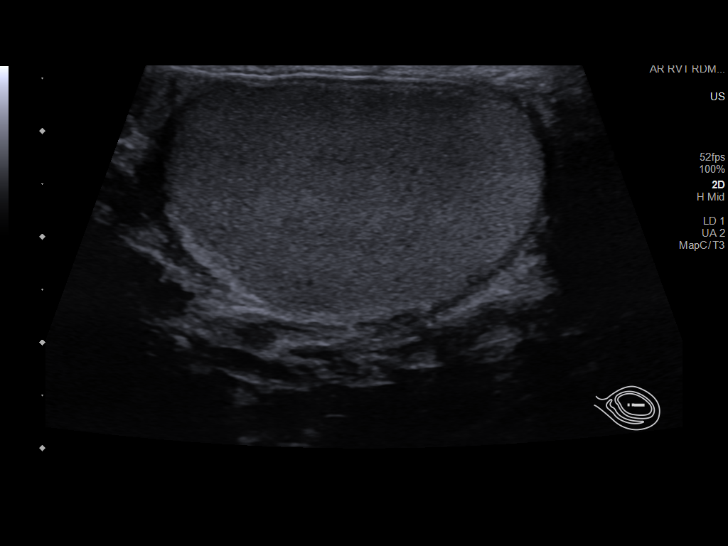
[im 44/59]
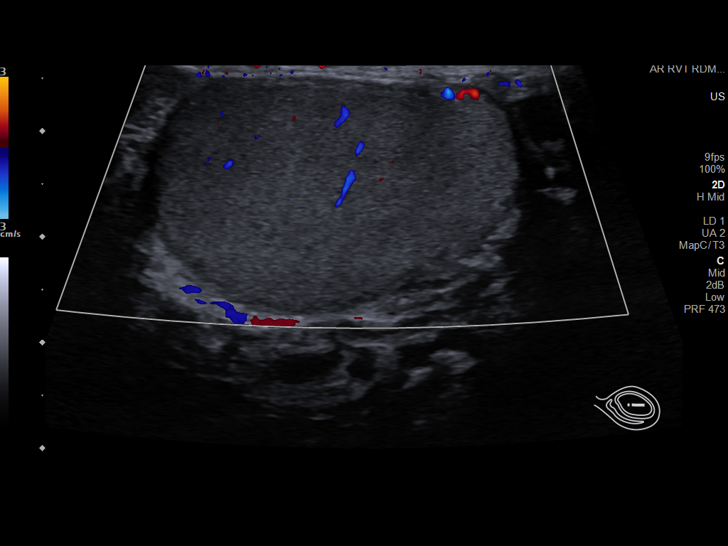
[im 49/59]
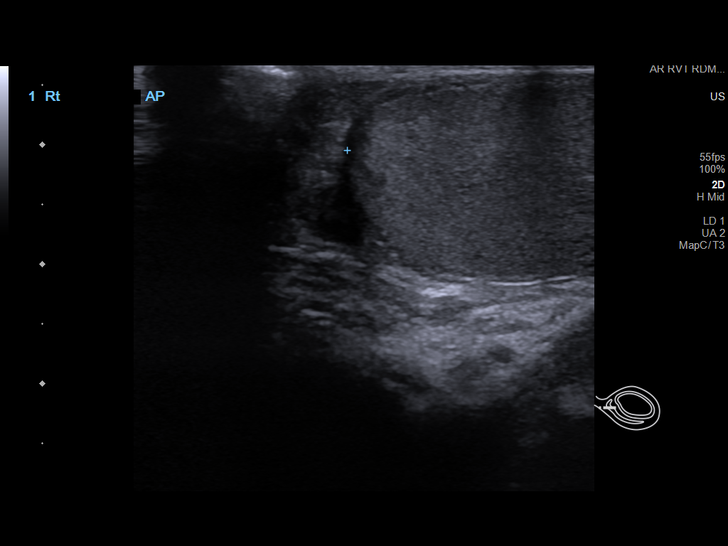
[im 54/59]
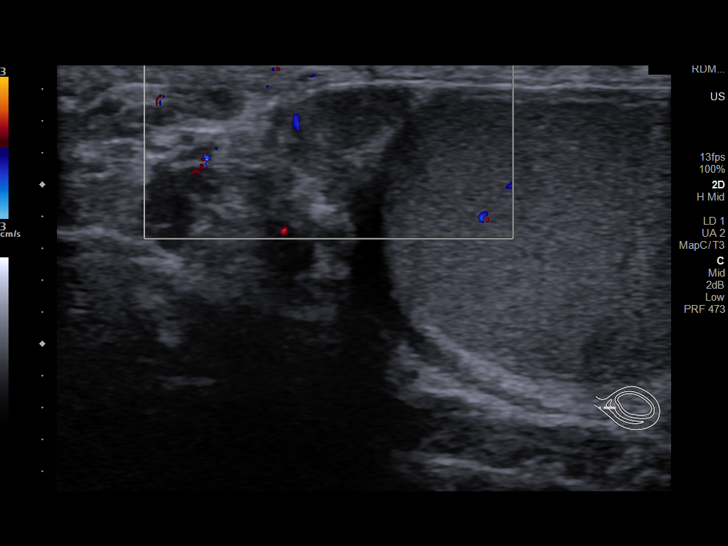
[im 59/59]
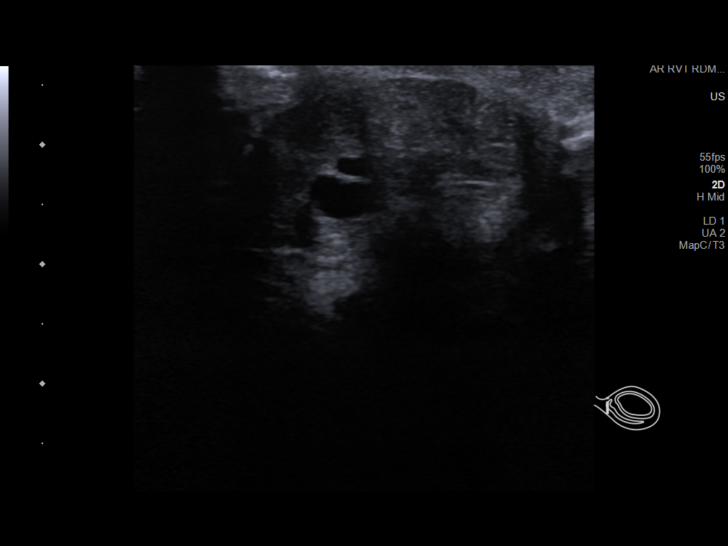

[14 of 25 positions shown; findings below may reference images not displayed]

FINDINGS: Right testicle

Measurements: 3.6 x 2.2 x 2.8 cm. No mass or microlithiasis
visualized.

Left testicle

Measurements: 3.7 x 2.3 x 2.6 cm. No mass or microlithiasis
visualized.

Right epididymis:  Normal in size and appearance.

Left epididymis:  Normal in size and appearance.

Hydrocele:  Very small LEFT hydrocele.

Varicocele:  None visualized.

Pulsed Doppler interrogation of both testes demonstrates normal low
resistance arterial and venous waveforms bilaterally.
IMPRESSION: No acute findings relative to the scrotum ir testes. Small LEFT
hydrocele and no sonographic evidence of testicular torsion.

## 2022-10-09 DIAGNOSIS — I10 Essential (primary) hypertension: Secondary | ICD-10-CM | POA: Diagnosis not present

## 2022-10-09 DIAGNOSIS — E785 Hyperlipidemia, unspecified: Secondary | ICD-10-CM | POA: Diagnosis not present

## 2022-11-09 DIAGNOSIS — I10 Essential (primary) hypertension: Secondary | ICD-10-CM | POA: Diagnosis not present

## 2022-11-09 DIAGNOSIS — E785 Hyperlipidemia, unspecified: Secondary | ICD-10-CM | POA: Diagnosis not present

## 2022-12-08 DIAGNOSIS — Z1389 Encounter for screening for other disorder: Secondary | ICD-10-CM | POA: Diagnosis not present

## 2022-12-08 DIAGNOSIS — Z0001 Encounter for general adult medical examination with abnormal findings: Secondary | ICD-10-CM | POA: Diagnosis not present

## 2022-12-08 DIAGNOSIS — F419 Anxiety disorder, unspecified: Secondary | ICD-10-CM | POA: Diagnosis not present

## 2022-12-08 DIAGNOSIS — Z7189 Other specified counseling: Secondary | ICD-10-CM | POA: Diagnosis not present

## 2022-12-08 DIAGNOSIS — Z8546 Personal history of malignant neoplasm of prostate: Secondary | ICD-10-CM | POA: Diagnosis not present

## 2022-12-08 DIAGNOSIS — Z6830 Body mass index (BMI) 30.0-30.9, adult: Secondary | ICD-10-CM | POA: Diagnosis not present

## 2022-12-08 DIAGNOSIS — I10 Essential (primary) hypertension: Secondary | ICD-10-CM | POA: Diagnosis not present

## 2022-12-08 DIAGNOSIS — Z1331 Encounter for screening for depression: Secondary | ICD-10-CM | POA: Diagnosis not present

## 2022-12-08 DIAGNOSIS — E785 Hyperlipidemia, unspecified: Secondary | ICD-10-CM | POA: Diagnosis not present

## 2023-01-08 DIAGNOSIS — E785 Hyperlipidemia, unspecified: Secondary | ICD-10-CM | POA: Diagnosis not present

## 2023-01-08 DIAGNOSIS — I10 Essential (primary) hypertension: Secondary | ICD-10-CM | POA: Diagnosis not present

## 2023-01-19 NOTE — Progress Notes (Signed)
History of Present Illness: Here for f/u of PCa--treated  8.31.2021: TRUS/Bx.  At that time, PSA 5.3, prostate volume 27 mL, PSA density 0.20.   2/12 cores revealed adenocarcinoma-left mid medial core GS 3+4 and 40% of core, left apex medial, GS 3+4 in 40% of sample.   2.11.2022: He underwent I-125 brachytherapy/SpaceOAR placement  9.3.2024: Here for routine check.  PSA at his last draw in March was nadir level at 0.6. IPSS 4/1. No blood in urine or stool, no dysuria.  Past Medical History:  Diagnosis Date   History of colitis 11/2019   ED visit in epic   Hyperlipidemia    Hypertension    followed by pcp   Prostate cancer [facility]) urologist--- dr Jennette Leask/  oncologist--- dr Kathrynn Running   dx 08/ 2021,  Stage T1c, Gleason 3+4   Wears glasses     Past Surgical History:  Procedure Laterality Date   COLONOSCOPY WITH PROPOFOL N/A 01/07/2021   Procedure: COLONOSCOPY WITH PROPOFOL;  Surgeon: Lanelle Bal, DO;  Location: [facility];  Service: Endoscopy;  Laterality: N/A;  11:45am   CYSTOSCOPY  06/29/2020   Procedure: CYSTOSCOPY FLEXIBLE;  Surgeon: Marcine Matar, MD;  Location: [facility];  Service: Urology;;  no seeds found in bladder   INGUINAL HERNIA REPAIR Left 10-21-2002  @[facility]    POLYPECTOMY  01/07/2021   Procedure: POLYPECTOMY;  Surgeon: Lanelle Bal, DO;  Location: [facility];  Service: Endoscopy;;   PROSTATE BIOPSY  08/ 2021 in urologist office   RADIOACTIVE SEED IMPLANT N/A 06/29/2020   Procedure: RADIOACTIVE SEED IMPLANT/BRACHYTHERAPY IMPLANT;  Surgeon: Marcine Matar, MD;  Location: [facility];  Service: Urology;  Laterality: N/A;    67  seeds implanted   SPACE OAR INSTILLATION N/A 06/29/2020   Procedure: SPACE OAR INSTILLATION;  Surgeon: Marcine Matar, MD;  Location: [facility];  Service: Urology;  Laterality: N/A;    Home Medications:  Allergies as of 01/20/2023   No Known Allergies      Medication List         Accurate as of January 19, 2023  4:52 PM. If you have any questions, ask your nurse or doctor.          amLODipine 5 MG tablet Commonly known as: NORVASC Take 5 mg by mouth daily.   atorvastatin 10 MG tablet Commonly known as: LIPITOR Take 10 mg by mouth daily.   hydrocortisone 2.5 % rectal cream Commonly known as: ANUSOL-HC Place rectally 3 (three) times daily. X 2 weeks   losartan-hydrochlorothiazide 100-25 MG tablet Commonly known as: HYZAAR Take 1 tablet by mouth daily.   sertraline 25 MG tablet Commonly known as: ZOLOFT Take 25 mg by mouth daily.   traMADol 50 MG tablet Commonly known as: ULTRAM Take 1 tablet (50 mg total) by mouth every 6 (six) hours as needed.        Allergies: No Known Allergies  Family History  Problem Relation Age of Onset   Hypertension Father    CAD Father    Cancer Father        type unknown   Prostate cancer Neg Hx    Colon cancer Neg Hx    Pancreatic cancer Neg Hx    Breast cancer Neg Hx     Social History:  reports that he quit smoking about 36 years ago. His smoking use included cigarettes. He started smoking about 54 years ago. He has a 18 pack-year smoking history. He has never used  smokeless tobacco. He reports current alcohol use. He reports that he does not currently use drugs after having used the following drugs: Marijuana.  ROS: A complete review of systems was performed.  All systems are negative except for pertinent findings as noted.  Physical Exam:  Vital signs in last 24 hours: There were no vitals taken for this visit. Constitutional:  Alert and oriented, No acute distress Cardiovascular: Regular rate  Respiratory: Normal respiratory effort Neurologic: Grossly intact, no focal deficits Psychiatric: Normal mood and affect  I have reviewed prior pt notes  I have reviewed urinalysis results  I have independently reviewed prior imaging-prostate ultrasound  I have reviewed prior chemistry, PSA and  pathology results  I have reviewed IPSS sheet   Impression/Assessment:  Favorable intermediate risk prostate cancer, status post I-125 brachytherapy about 2-1/2 years ago.  Excellent PSA response thus far, no radiation related toxicity  Plan:  I will check PSA today  Office visit in 9 months for recheck

## 2023-01-20 ENCOUNTER — Ambulatory Visit (INDEPENDENT_AMBULATORY_CARE_PROVIDER_SITE_OTHER): Payer: Medicare PPO | Admitting: Urology

## 2023-01-20 ENCOUNTER — Encounter: Payer: Self-pay | Admitting: Urology

## 2023-01-20 VITALS — BP 115/73 | HR 76

## 2023-01-20 DIAGNOSIS — Z8546 Personal history of malignant neoplasm of prostate: Secondary | ICD-10-CM

## 2023-01-20 DIAGNOSIS — R3129 Other microscopic hematuria: Secondary | ICD-10-CM

## 2023-01-20 LAB — MICROSCOPIC EXAMINATION
Bacteria, UA: NONE SEEN
RBC, Urine: NONE SEEN /HPF (ref 0–2)

## 2023-01-20 LAB — URINALYSIS, ROUTINE W REFLEX MICROSCOPIC
Bilirubin, UA: NEGATIVE
Glucose, UA: NEGATIVE
Ketones, UA: NEGATIVE
Nitrite, UA: NEGATIVE
Protein,UA: NEGATIVE
RBC, UA: NEGATIVE
Specific Gravity, UA: 1.01 (ref 1.005–1.030)
Urobilinogen, Ur: 0.2 mg/dL (ref 0.2–1.0)
pH, UA: 6.5 (ref 5.0–7.5)

## 2023-01-21 LAB — PSA: Prostate Specific Ag, Serum: 0.6 ng/mL (ref 0.0–4.0)

## 2023-02-08 DIAGNOSIS — E785 Hyperlipidemia, unspecified: Secondary | ICD-10-CM | POA: Diagnosis not present

## 2023-02-08 DIAGNOSIS — I10 Essential (primary) hypertension: Secondary | ICD-10-CM | POA: Diagnosis not present

## 2023-03-10 DIAGNOSIS — E785 Hyperlipidemia, unspecified: Secondary | ICD-10-CM | POA: Diagnosis not present

## 2023-03-10 DIAGNOSIS — I10 Essential (primary) hypertension: Secondary | ICD-10-CM | POA: Diagnosis not present

## 2023-04-03 ENCOUNTER — Emergency Department (HOSPITAL_COMMUNITY)
Admission: EM | Admit: 2023-04-03 | Discharge: 2023-04-03 | Disposition: A | Discharge status: Home / Self Care | Payer: No Typology Code available for payment source | Attending: Emergency Medicine | Admitting: Emergency Medicine

## 2023-04-03 ENCOUNTER — Other Ambulatory Visit: Payer: Self-pay

## 2023-04-03 ENCOUNTER — Encounter (HOSPITAL_COMMUNITY): Payer: Self-pay

## 2023-04-03 ENCOUNTER — Emergency Department (HOSPITAL_COMMUNITY): Payer: No Typology Code available for payment source

## 2023-04-03 DIAGNOSIS — I10 Essential (primary) hypertension: Secondary | ICD-10-CM | POA: Insufficient documentation

## 2023-04-03 DIAGNOSIS — S6000XA Contusion of unspecified finger without damage to nail, initial encounter: Secondary | ICD-10-CM

## 2023-04-03 DIAGNOSIS — W1839XA Other fall on same level, initial encounter: Secondary | ICD-10-CM | POA: Insufficient documentation

## 2023-04-03 DIAGNOSIS — Z79899 Other long term (current) drug therapy: Secondary | ICD-10-CM | POA: Diagnosis not present

## 2023-04-03 DIAGNOSIS — S6992XA Unspecified injury of left wrist, hand and finger(s), initial encounter: Secondary | ICD-10-CM | POA: Diagnosis present

## 2023-04-03 DIAGNOSIS — S60012A Contusion of left thumb without damage to nail, initial encounter: Secondary | ICD-10-CM | POA: Diagnosis not present

## 2023-04-03 MED ORDER — NAPROXEN 250 MG PO TABS
500.0000 mg | ORAL_TABLET | Freq: Once | ORAL | Status: AC
  Administered 2023-04-03: 500 mg via ORAL
  Filled 2023-04-03: qty 2

## 2023-04-03 NOTE — Discharge Instructions (Signed)
Tylenol or ibuprofen for pain, there is no obvious broken bones however I would like for you to have a recheck at your doctor's office within 1 week, if you are still having pain you will need to have a repeat x-ray to make sure that your scaphoid bone does not appear broken.  It does not appear broken on today's x-ray however it can be very tricky and sometimes needs to have a repeat x-ray in 7 to 10 days.  ER for severe worsening symptoms

## 2023-04-03 NOTE — ED Triage Notes (Signed)
Pt c/o of left hand pain after falling at work. Pt denies LOC or hitting head just falling on his hand.

## 2023-04-03 NOTE — ED Provider Notes (Signed)
[place] EMERGENCY DEPARTMENT AT [facility] Provider Note   CSN: [id number] Arrival date & time: 04/03/23  1451     History  Chief Complaint  Patient presents with   Hand Pain    Kent Smith is a 72 y.o. male.   Hand Pain   72 year old male, history of hypertension and high cholesterol, presents after having an accidental fall where he fell onto his bottom and tried to brace himself with his left hand.  He had acute onset of pain over the thenar eminence of the left hand extending from the base of the thumb towards the thumb.  There is no dislocations or deformities just mild bruising.  Denies any other injuries    Home Medications Prior to Admission medications   Medication Sig Start Date End Date Taking? Authorizing Provider  amLODipine (NORVASC) 5 MG tablet Take 5 mg by mouth daily. 09/09/19   [provider]  atorvastatin (LIPITOR) 10 MG tablet Take 10 mg by mouth daily. 10/28/19   [provider]  hydrocortisone (ANUSOL-HC) 2.5 % rectal cream Place rectally 3 (three) times daily. X 2 weeks 02/09/21   Tat, Onalee Hua, MD  losartan-hydrochlorothiazide (HYZAAR) 100-25 MG tablet Take 1 tablet by mouth daily. 05/24/21   [provider]  sertraline (ZOLOFT) 25 MG tablet Take 25 mg by mouth daily. 03/01/21   [provider]  traMADol (ULTRAM) 50 MG tablet Take 1 tablet (50 mg total) by mouth every 6 (six) hours as needed. 03/01/21   Triplett, Tammy, PA-C      Allergies    Patient has no known allergies.    Review of Systems   Review of Systems  Musculoskeletal:  Positive for joint swelling.  Skin:  Negative for wound.  Neurological:  Negative for weakness and numbness.    Physical Exam Updated Vital Signs BP 129/85   Pulse 73   Temp 98.2 F (36.8 C) (Oral)   Ht 1.727 m (5\' 8" )   Wt 85.7 kg   SpO2 98%   BMI 28.74 kg/m  Physical Exam Vitals and nursing note reviewed.  Constitutional:      Appearance: He is  well-developed. He is not diaphoretic.  HENT:     Head: Normocephalic and atraumatic.  Eyes:     General:        Right eye: No discharge.        Left eye: No discharge.     Conjunctiva/sclera: Conjunctivae normal.  Pulmonary:     Effort: Pulmonary effort is normal. No respiratory distress.  Musculoskeletal:        General: Swelling, tenderness and signs of injury present. No deformity.     Right lower leg: No edema.     Left lower leg: No edema.     Comments: The patient has mild swelling and bruising over the thenar eminence of the left hand, good range of motion of the thumb though some pain with range of motion.  He can fully rotate extend and flex the thumb without significant difficulty.  He has minimal pain in the anatomic snuffbox, the rest of his left upper extremity was examined in its entirety without pain or decreased range of motion  Skin:    General: Skin is warm and dry.     Findings: No erythema or rash.  Neurological:     Mental Status: He is alert.     Coordination: Coordination normal.     ED Results / Procedures / Treatments   Labs (all  labs ordered are listed, but only abnormal results are displayed) Labs Reviewed - No data to display  EKG None  Radiology DG Hand Complete Left  Result Date: 04/03/2023 CLINICAL DATA:  Left hand pain after a fall. EXAM: LEFT HAND - COMPLETE 3+ VIEW COMPARISON:  None Available. FINDINGS: No acute displaced fracture or dislocation is identified. There is a linear lucency through the ulnar styloid for which a nondisplaced fracture is not excluded. Soft tissue swelling is noted over the ulnar aspect of the hand and wrist. IMPRESSION: Soft tissue swelling with questionable nondisplaced ulnar styloid fracture. Electronically Signed   By: Sebastian Ache M.D.   On: 04/03/2023 17:59    Procedures Procedures    Medications Ordered in ED Medications  naproxen (NAPROSYN) tablet 500 mg (500 mg Oral Given 04/03/23 1611)    ED Course/  Medical Decision Making/ A&P                                 Medical Decision Making Amount and/or Complexity of Data Reviewed Radiology: ordered.  Risk Prescription drug management.   Normal capillary refill, normal blood flow, normal pulses, normal strength and sensation, tenderness exist over the left thenar eminence suggestive of a possible soft tissue injury, will obtain imaging to rule out fracture otherwise plan for thumb spica given minimum snuffbox tenderness and the nature of the fall and close follow-up.  I personally viewed the interpreted the x-rays and I see no signs of scaphoid fracture though this could be early.  Patient is agreeable, otherwise healthy without any other significant signs of injury.  Medication management: Naprosyn given, plan for NSAIDs for home  Imaging: I personally viewed and interpreted the x-ray, I do not think there is any signs of broken bones other than a minimal possible ulnar styloid fracture which is tiny.  There is nothing in the scaphoid of the rest of the hand that appears fractured.  Patient updated, placed in a thumb spica immobilizer, stable for discharge with rice therapy.        Final Clinical Impression(s) / ED Diagnoses Final diagnoses:  Contusion of finger of left hand, initial encounter    Rx / DC Orders ED Discharge Orders     None         Eber Hong, MD 04/03/23 1814

## 2023-04-10 DIAGNOSIS — I10 Essential (primary) hypertension: Secondary | ICD-10-CM | POA: Diagnosis not present

## 2023-04-10 DIAGNOSIS — E785 Hyperlipidemia, unspecified: Secondary | ICD-10-CM | POA: Diagnosis not present

## 2023-04-23 ENCOUNTER — Telehealth: Payer: Self-pay

## 2023-04-23 NOTE — Telephone Encounter (Signed)
Transition Care Management Unsuccessful Follow-up Telephone Call  Date of discharge and from where:  [facility] 11/15  Attempts:  1st Attempt  Reason for unsuccessful TCM follow-up call:  No answer/busy   Kent Smith [place]  [facility], [facility] Guide, Phone: [phone] Website: [email or web address]

## 2023-04-24 ENCOUNTER — Telehealth: Payer: Self-pay

## 2023-04-24 NOTE — Telephone Encounter (Signed)
Transition Care Management Follow-up Telephone Call Date of discharge and from where: [facility] How have you been since you were released from the hospital? [facility] is doing well [facility] Any questions or concerns? No  Items Reviewed: Did the pt receive and understand the discharge instructions provided? Yes  Medications obtained and verified? Yes  Other? No  Any new allergies since your discharge? No  Dietary orders reviewed? No  Do you have support at home? Yes     Follow up appointments reviewed:  PCP Hospital f/u appt confirmed? Yes  [facility] @ . Specialist Hospital f/u appt confirmed? No  Scheduled to see  on  @  Are transportation arrangements needed? No  If their condition worsens, is the pt aware to call PCP or go to the Emergency Dept.? Yes Was the patient provided with contact information for the PCP's office or ED? Yes Was to pt encouraged to call back with questions or concerns? Yes

## 2023-05-10 DIAGNOSIS — E785 Hyperlipidemia, unspecified: Secondary | ICD-10-CM | POA: Diagnosis not present

## 2023-05-10 DIAGNOSIS — I10 Essential (primary) hypertension: Secondary | ICD-10-CM | POA: Diagnosis not present

## 2023-06-10 DIAGNOSIS — I10 Essential (primary) hypertension: Secondary | ICD-10-CM | POA: Diagnosis not present

## 2023-06-10 DIAGNOSIS — E785 Hyperlipidemia, unspecified: Secondary | ICD-10-CM | POA: Diagnosis not present

## 2023-07-13 DIAGNOSIS — Z8546 Personal history of malignant neoplasm of prostate: Secondary | ICD-10-CM | POA: Diagnosis not present

## 2023-07-13 DIAGNOSIS — E785 Hyperlipidemia, unspecified: Secondary | ICD-10-CM | POA: Diagnosis not present

## 2023-07-13 DIAGNOSIS — J069 Acute upper respiratory infection, unspecified: Secondary | ICD-10-CM | POA: Diagnosis not present

## 2023-07-13 DIAGNOSIS — I10 Essential (primary) hypertension: Secondary | ICD-10-CM | POA: Diagnosis not present

## 2023-08-10 DIAGNOSIS — E785 Hyperlipidemia, unspecified: Secondary | ICD-10-CM | POA: Diagnosis not present

## 2023-08-10 DIAGNOSIS — I10 Essential (primary) hypertension: Secondary | ICD-10-CM | POA: Diagnosis not present

## 2023-09-10 DIAGNOSIS — I10 Essential (primary) hypertension: Secondary | ICD-10-CM | POA: Diagnosis not present

## 2023-09-10 DIAGNOSIS — E785 Hyperlipidemia, unspecified: Secondary | ICD-10-CM | POA: Diagnosis not present

## 2023-10-12 DIAGNOSIS — E785 Hyperlipidemia, unspecified: Secondary | ICD-10-CM | POA: Diagnosis not present

## 2023-10-12 DIAGNOSIS — I10 Essential (primary) hypertension: Secondary | ICD-10-CM | POA: Diagnosis not present

## 2023-10-19 ENCOUNTER — Ambulatory Visit: Payer: Self-pay | Admitting: Podiatry

## 2023-10-19 NOTE — Progress Notes (Signed)
 History of Present Illness: Here for f/u of PCa--treated  8.31.2021: TRUS/Bx.  At that time, PSA 5.3, prostate volume 27 mL, PSA density 0.20.   2/12 cores revealed adenocarcinoma-left mid medial core GS 3+4 and 40% of core, left apex medial, GS 3+4 in 40% of sample.   2.11.2022: He underwent I-125 brachytherapy/SpaceOAR placement  9.3.2024: PSA stable @ 0.6  6.3.2025: Here for routine check--9 mos. since last seen. No blood in urine or stool.  Overall he is voiding well.  IPSS 9/2  Past Medical History:  Diagnosis Date   History of colitis 11/2019   ED visit in epic   Hyperlipidemia    Hypertension    followed by pcp   Prostate cancer [facility]) urologist--- dr Nashid Pellum/  oncologist--- dr Lorri Rota   dx 08/ 2021,  Stage T1c, Gleason 3+4   Wears glasses     Past Surgical History:  Procedure Laterality Date   COLONOSCOPY WITH PROPOFOL  N/A 01/07/2021   Procedure: COLONOSCOPY WITH PROPOFOL ;  Surgeon: Vinetta Greening, DO;  Location: [facility];  Service: Endoscopy;  Laterality: N/A;  11:45am   CYSTOSCOPY  06/29/2020   Procedure: CYSTOSCOPY FLEXIBLE;  Surgeon: Trent Frizzle, MD;  Location: [facility];  Service: Urology;;  no seeds found in bladder   INGUINAL HERNIA REPAIR Left 10-21-2002  @[facility]    POLYPECTOMY  01/07/2021   Procedure: POLYPECTOMY;  Surgeon: Vinetta Greening, DO;  Location: [facility];  Service: Endoscopy;;   PROSTATE BIOPSY  08/ 2021 in urologist office   RADIOACTIVE SEED IMPLANT N/A 06/29/2020   Procedure: RADIOACTIVE SEED IMPLANT/BRACHYTHERAPY IMPLANT;  Surgeon: Trent Frizzle, MD;  Location: [facility];  Service: Urology;  Laterality: N/A;    67  seeds implanted   SPACE OAR INSTILLATION N/A 06/29/2020   Procedure: SPACE OAR INSTILLATION;  Surgeon: Trent Frizzle, MD;  Location: [facility];  Service: Urology;  Laterality: N/A;    Home Medications:  Allergies as of 10/20/2023   No Known Allergies       Medication List        Accurate as of October 19, 2023  8:46 AM. If you have any questions, ask your nurse or doctor.          amLODipine  5 MG tablet Commonly known as: NORVASC  Take 5 mg by mouth daily.   atorvastatin  10 MG tablet Commonly known as: LIPITOR Take 10 mg by mouth daily.   hydrocortisone  2.5 % rectal cream Commonly known as: ANUSOL -HC Place rectally 3 (three) times daily. X 2 weeks   losartan-hydrochlorothiazide  100-25 MG tablet Commonly known as: HYZAAR Take 1 tablet by mouth daily.   sertraline 25 MG tablet Commonly known as: ZOLOFT Take 25 mg by mouth daily.   traMADol  50 MG tablet Commonly known as: ULTRAM  Take 1 tablet (50 mg total) by mouth every 6 (six) hours as needed.        Allergies: No Known Allergies  Family History  Problem Relation Age of Onset   Hypertension Father    CAD Father    Cancer Father        type unknown   Prostate cancer Neg Hx    Colon cancer Neg Hx    Pancreatic cancer Neg Hx    Breast cancer Neg Hx     Social History:  reports that he quit smoking about 37 years ago. His smoking use included cigarettes. He started smoking about 55 years ago. He has a 18 pack-year smoking history. He has  never used smokeless tobacco. He reports current alcohol  use. He reports that he does not currently use drugs after having used the following drugs: Marijuana.  ROS: A complete review of systems was performed.  All systems are negative except for pertinent findings as noted.  Physical Exam:  Vital signs in last 24 hours: There were no vitals taken for this visit. Constitutional:  Alert and oriented, No acute distress Cardiovascular: Regular rate  Respiratory: Normal respiratory effort GU: Normal anal sphincter tone.  Prostate smooth, flat.  No nodularity.  No rectal masses. Neurologic: Grossly intact, no focal deficits Psychiatric: Normal mood and affect  I have reviewed prior pt notes  I have reviewed urinalysis  results  I have independently reviewed prior imaging-prostate ultrasound volume  I have reviewed prior chemistry, PSA and pathology results  I have reviewed IPSS sheet   Impression/Assessment:  Favorable intermediate risk prostate cancer, status post I-125 brachytherapy in early 2022.  Excellent PSA result thus far without any radiation related toxicity  Plan:  I will check his PSA today  At this point, we will follow-up on a yearly basis

## 2023-10-20 ENCOUNTER — Encounter: Payer: Self-pay | Admitting: Urology

## 2023-10-20 ENCOUNTER — Ambulatory Visit (INDEPENDENT_AMBULATORY_CARE_PROVIDER_SITE_OTHER): Payer: No Typology Code available for payment source | Admitting: Urology

## 2023-10-20 VITALS — BP 135/73 | HR 85

## 2023-10-20 DIAGNOSIS — Z8546 Personal history of malignant neoplasm of prostate: Secondary | ICD-10-CM

## 2023-10-20 DIAGNOSIS — R3129 Other microscopic hematuria: Secondary | ICD-10-CM | POA: Diagnosis not present

## 2023-10-20 LAB — MICROSCOPIC EXAMINATION
Bacteria, UA: NONE SEEN
WBC, UA: NONE SEEN /HPF (ref 0–5)

## 2023-10-20 LAB — URINALYSIS, ROUTINE W REFLEX MICROSCOPIC
Bilirubin, UA: NEGATIVE
Glucose, UA: NEGATIVE
Ketones, UA: NEGATIVE
Leukocytes,UA: NEGATIVE
Nitrite, UA: NEGATIVE
Protein,UA: NEGATIVE
Specific Gravity, UA: 1.025 (ref 1.005–1.030)
Urobilinogen, Ur: 1 mg/dL (ref 0.2–1.0)
pH, UA: 6 (ref 5.0–7.5)

## 2023-10-21 ENCOUNTER — Other Ambulatory Visit: Payer: Self-pay | Admitting: Urology

## 2023-10-21 ENCOUNTER — Ambulatory Visit: Payer: Self-pay | Admitting: Urology

## 2023-10-21 DIAGNOSIS — Z8546 Personal history of malignant neoplasm of prostate: Secondary | ICD-10-CM

## 2023-10-21 LAB — PSA: Prostate Specific Ag, Serum: 1 ng/mL (ref 0.0–4.0)

## 2023-11-03 DIAGNOSIS — I10 Essential (primary) hypertension: Secondary | ICD-10-CM | POA: Diagnosis not present

## 2023-11-03 DIAGNOSIS — R079 Chest pain, unspecified: Secondary | ICD-10-CM | POA: Diagnosis not present

## 2023-11-11 DIAGNOSIS — E785 Hyperlipidemia, unspecified: Secondary | ICD-10-CM | POA: Diagnosis not present

## 2023-11-11 DIAGNOSIS — I10 Essential (primary) hypertension: Secondary | ICD-10-CM | POA: Diagnosis not present

## 2023-12-11 DIAGNOSIS — E785 Hyperlipidemia, unspecified: Secondary | ICD-10-CM | POA: Diagnosis not present

## 2023-12-11 DIAGNOSIS — I10 Essential (primary) hypertension: Secondary | ICD-10-CM | POA: Diagnosis not present

## 2024-01-01 ENCOUNTER — Other Ambulatory Visit: Payer: Self-pay

## 2024-01-01 ENCOUNTER — Emergency Department (HOSPITAL_COMMUNITY)
Admission: EM | Admit: 2024-01-01 | Discharge: 2024-01-01 | Disposition: A | Discharge status: Home / Self Care | Attending: Emergency Medicine | Admitting: Emergency Medicine

## 2024-01-01 ENCOUNTER — Emergency Department (HOSPITAL_COMMUNITY)

## 2024-01-01 DIAGNOSIS — S300XXA Contusion of lower back and pelvis, initial encounter: Secondary | ICD-10-CM | POA: Insufficient documentation

## 2024-01-01 DIAGNOSIS — Z8546 Personal history of malignant neoplasm of prostate: Secondary | ICD-10-CM | POA: Diagnosis not present

## 2024-01-01 DIAGNOSIS — S3992XA Unspecified injury of lower back, initial encounter: Secondary | ICD-10-CM | POA: Diagnosis present

## 2024-01-01 DIAGNOSIS — W19XXXA Unspecified fall, initial encounter: Secondary | ICD-10-CM | POA: Diagnosis not present

## 2024-01-01 DIAGNOSIS — K625 Hemorrhage of anus and rectum: Secondary | ICD-10-CM | POA: Insufficient documentation

## 2024-01-01 LAB — CBC WITH DIFFERENTIAL/PLATELET
Abs Immature Granulocytes: 0.02 K/uL (ref 0.00–0.07)
Basophils Absolute: 0 K/uL (ref 0.0–0.1)
Basophils Relative: 1 %
Eosinophils Absolute: 0.2 K/uL (ref 0.0–0.5)
Eosinophils Relative: 3 %
HCT: 38.1 % — ABNORMAL LOW (ref 39.0–52.0)
Hemoglobin: 13 g/dL (ref 13.0–17.0)
Immature Granulocytes: 0 %
Lymphocytes Relative: 37 %
Lymphs Abs: 2.6 K/uL (ref 0.7–4.0)
MCH: 30.6 pg (ref 26.0–34.0)
MCHC: 34.1 g/dL (ref 30.0–36.0)
MCV: 89.6 fL (ref 80.0–100.0)
Monocytes Absolute: 0.6 K/uL (ref 0.1–1.0)
Monocytes Relative: 9 %
Neutro Abs: 3.6 K/uL (ref 1.7–7.7)
Neutrophils Relative %: 50 %
Platelets: 298 K/uL (ref 150–400)
RBC: 4.25 MIL/uL (ref 4.22–5.81)
RDW: 13.9 % (ref 11.5–15.5)
WBC: 7.1 K/uL (ref 4.0–10.5)
nRBC: 0 % (ref 0.0–0.2)

## 2024-01-01 LAB — BASIC METABOLIC PANEL WITH GFR
Anion gap: 11 (ref 5–15)
BUN: 14 mg/dL (ref 8–23)
CO2: 25 mmol/L (ref 22–32)
Calcium: 9.2 mg/dL (ref 8.9–10.3)
Chloride: 104 mmol/L (ref 98–111)
Creatinine, Ser: 0.98 mg/dL (ref 0.61–1.24)
GFR, Estimated: >60 mL/min (ref >60)
Glucose, Bld: 78 mg/dL (ref 70–99)
Potassium: 3.5 mmol/L (ref 3.5–5.1)
Sodium: 140 mmol/L (ref 135–145)

## 2024-01-01 NOTE — ED Provider Notes (Signed)
 [place] EMERGENCY DE[place]MENT AT [facility] Provider Note   CSN: [id number] Arrival date & time: 01/01/24  9062     Patient presents with: Rectal Bleeding   Kent Smith is a 73 y.o. male.  He said he fell yesterday getting off the porch.  Did not think much of it.  No loss of consciousness.  Today he noticed he had some blood in his stool when he had a bowel movement.  He said he did not look at it closely though.  No abdominal pain.  Does have a little back pain from the fall.  No fevers chills nausea vomiting.  He does say that he is had some rectal bleeding in the past due to his prostate cancer and radiation.  No urinary symptoms.  Not on a blood thinner   The history is provided by the patient.  Rectal Bleeding Quality:  Unable to specify Context: defecation   Similar prior episodes: yes   Relieved by:  None tried Associated symptoms: no abdominal pain, no dizziness, no epistaxis, no fever, no hematemesis, no light-headedness, no loss of consciousness and no vomiting   Risk factors: no anticoagulant use        Prior to Admission medications   Medication Sig Start Date End Date Taking? Authorizing Provider  amLODipine (NORVASC) 5 MG tablet Take 5 mg by mouth daily. 09/09/19   [provider]  atorvastatin (LIPITOR) 10 MG tablet Take 10 mg by mouth daily. 10/28/19   [provider]  hydrocortisone (ANUSOL-HC) 2.5 % rectal cream Place rectally 3 (three) times daily. X 2 weeks 02/09/21   Tat, Alm, MD  losartan-hydrochlorothiazide (HYZAAR) 100-25 MG tablet Take 1 tablet by mouth daily. 05/24/21   [provider]  sertraline (ZOLOFT) 25 MG tablet Take 25 mg by mouth daily. 03/01/21   [provider]  traMADol (ULTRAM) 50 MG tablet Take 1 tablet (50 mg total) by mouth every 6 (six) hours as needed. 03/01/21   Triplett, Tammy, PA-C    Allergies: Patient has no known allergies.    Review of Systems  Constitutional:  Negative for  fever.  HENT:  Negative for nosebleeds.   Gastrointestinal:  Positive for hematochezia. Negative for abdominal pain, hematemesis and vomiting.  Neurological:  Negative for dizziness, loss of consciousness and light-headedness.    Updated Vital Signs BP (!) 176/92 (BP Location: Right Arm)   Pulse 85   Temp 98.4 F (36.9 C) (Oral)   Resp 14   Ht 5' 8 (1.727 m)   Wt 86.2 kg   SpO2 100%   BMI 28.89 kg/m   Physical Exam Vitals and nursing note reviewed.  Constitutional:      Appearance: Normal appearance. He is well-developed.  HENT:     Head: Normocephalic and atraumatic.  Eyes:     Conjunctiva/sclera: Conjunctivae normal.  Cardiovascular:     Rate and Rhythm: Normal rate and regular rhythm.     Heart sounds: No murmur heard. Pulmonary:     Effort: Pulmonary effort is normal. No respiratory distress.     Breath sounds: Normal breath sounds.  Abdominal:     Palpations: Abdomen is soft.     Tenderness: There is no abdominal tenderness. There is no guarding or rebound.  Musculoskeletal:        General: Tenderness present.     Cervical back: Neck supple.     Comments: He has some mild left paralumbar tenderness  Skin:    General: Skin is warm  and dry.  Neurological:     General: No focal deficit present.     Mental Status: He is alert.     GCS: GCS eye subscore is 4. GCS verbal subscore is 5. GCS motor subscore is 6.     Sensory: No sensory deficit.     Motor: No weakness.     (all labs ordered are listed, but only abnormal results are displayed) Labs Reviewed  CBC WITH DIFFERENTIAL/PLATELET - Abnormal; Notable for the following components:      Result Value   HCT 38.1 (*)    All other components within normal limits  BASIC METABOLIC PANEL WITH GFR    EKG: None  Radiology: DG Lumbar Spine Complete Result Date: 01/01/2024 CLINICAL DATA:  Back pain. EXAM: LUMBAR SPINE - COMPLETE 4+ VIEW COMPARISON:  None Available. FINDINGS: 5 nonrib bearing lumbar-type  vertebral bodies. Vertebral body heights are maintained. No acute fracture.No static listhesis. Multilevel degenerative disc height loss and endplate osteophytosis, most pronounced at L3-L4, L4-L5, and L5-S1. SI joints are anatomically aligned. Prostate brachytherapy seeds are noted. IMPRESSION: 1. No acute fracture or traumatic listhesis of the lumbar spine. 2. Multilevel degenerative disc changes, as above. Electronically Signed   By: Harrietta Sherry M.D.   On: 01/01/2024 11:49     Procedures   Medications Ordered in the ED - No data to display  Clinical Course as of 01/01/24 1715  Fri Jan 01, 2024  1158 Patient's imaging and lab work are stable.  Reviewed with him and he is comfortable plan for discharge.  Recommended he monitor the bleeding in his stool and follow-up with his PCP.  Return instructions discussed [MB]    Clinical Course User Index [MB] Towana Ozell BROCKS, MD                                 Medical Decision Making Amount and/or Complexity of Data Reviewed Labs: ordered. Radiology: ordered.   This patient complains of 1 episode of rectal bleeding, low back pain after fall; this involves an extensive number of treatment Options and is a complaint that carries with it a high risk of complications and morbidity. The differential includes contusion, hemorrhoids, diverticular bleed, fracture  I ordered, reviewed and interpreted labs, which included CBC with stable hemoglobin, chemistries unremarkable I ordered imaging studies which included lumbar spine x-Kent Smith and I independently    visualized and interpreted imaging which showed degenerative changes no fracture Previous records obtained and reviewed in epic including recent PCP and urology notes Cardiac monitoring reviewed, sinus rhythm Social determinants considered, no significant barriers Critical Interventions: None  After the interventions stated above, I reevaluated the patient and found patient to be currently  asymptomatic Admission and further testing considered, no indications for admission or further workup at this time.  Will have patient follow-up with his PCP and recommended he continue to monitor bleeding and return if any worsening.  Return instructions discussed      Final diagnoses:  Contusion of lower back, initial encounter  Rectal bleeding    ED Discharge Orders     None          Towana Ozell BROCKS, MD 01/01/24 [id number]

## 2024-01-01 NOTE — ED Triage Notes (Signed)
 Pt the ED with complaints of bloody stools this morning. Pt states he had a fall last night and is complaining of lower left back and is concerned the bleeding is related to the fall.

## 2024-01-01 NOTE — Discharge Instructions (Signed)
 Please use ice or heat to your low back and Tylenol  for pain.  Monitor the rectal bleeding and follow-up with your primary care doctor.  Return if any worsening or concerning symptoms

## 2024-01-11 DIAGNOSIS — I10 Essential (primary) hypertension: Secondary | ICD-10-CM | POA: Diagnosis not present

## 2024-01-11 DIAGNOSIS — E785 Hyperlipidemia, unspecified: Secondary | ICD-10-CM | POA: Diagnosis not present

## 2024-01-25 DIAGNOSIS — Z8546 Personal history of malignant neoplasm of prostate: Secondary | ICD-10-CM | POA: Diagnosis not present

## 2024-01-25 DIAGNOSIS — Z1389 Encounter for screening for other disorder: Secondary | ICD-10-CM | POA: Diagnosis not present

## 2024-01-25 DIAGNOSIS — I10 Essential (primary) hypertension: Secondary | ICD-10-CM | POA: Diagnosis not present

## 2024-01-25 DIAGNOSIS — E785 Hyperlipidemia, unspecified: Secondary | ICD-10-CM | POA: Diagnosis not present

## 2024-01-25 DIAGNOSIS — F419 Anxiety disorder, unspecified: Secondary | ICD-10-CM | POA: Diagnosis not present

## 2024-01-25 DIAGNOSIS — Z0001 Encounter for general adult medical examination with abnormal findings: Secondary | ICD-10-CM | POA: Diagnosis not present

## 2024-01-25 DIAGNOSIS — Z23 Encounter for immunization: Secondary | ICD-10-CM | POA: Diagnosis not present

## 2024-01-25 DIAGNOSIS — Z1331 Encounter for screening for depression: Secondary | ICD-10-CM | POA: Diagnosis not present

## 2024-02-01 DIAGNOSIS — Z0001 Encounter for general adult medical examination with abnormal findings: Secondary | ICD-10-CM | POA: Diagnosis not present

## 2024-02-01 DIAGNOSIS — I10 Essential (primary) hypertension: Secondary | ICD-10-CM | POA: Diagnosis not present

## 2024-02-01 DIAGNOSIS — E785 Hyperlipidemia, unspecified: Secondary | ICD-10-CM | POA: Diagnosis not present

## 2024-02-24 DIAGNOSIS — I10 Essential (primary) hypertension: Secondary | ICD-10-CM | POA: Diagnosis not present

## 2024-02-24 DIAGNOSIS — E785 Hyperlipidemia, unspecified: Secondary | ICD-10-CM | POA: Diagnosis not present

## 2024-03-26 DIAGNOSIS — I10 Essential (primary) hypertension: Secondary | ICD-10-CM | POA: Diagnosis not present

## 2024-03-26 DIAGNOSIS — E785 Hyperlipidemia, unspecified: Secondary | ICD-10-CM | POA: Diagnosis not present

## 2024-04-05 DIAGNOSIS — Z87891 Personal history of nicotine dependence: Secondary | ICD-10-CM | POA: Diagnosis not present

## 2024-04-05 DIAGNOSIS — I10 Essential (primary) hypertension: Secondary | ICD-10-CM | POA: Diagnosis not present

## 2024-04-05 DIAGNOSIS — Z833 Family history of diabetes mellitus: Secondary | ICD-10-CM | POA: Diagnosis not present

## 2024-04-05 DIAGNOSIS — E785 Hyperlipidemia, unspecified: Secondary | ICD-10-CM | POA: Diagnosis not present

## 2024-04-26 ENCOUNTER — Other Ambulatory Visit

## 2024-04-26 DIAGNOSIS — Z8546 Personal history of malignant neoplasm of prostate: Secondary | ICD-10-CM

## 2024-04-27 LAB — PSA: Prostate Specific Ag, Serum: 1.9 ng/mL (ref 0.0–4.0)

## 2024-05-17 ENCOUNTER — Other Ambulatory Visit: Payer: Self-pay | Admitting: Urology

## 2024-05-17 DIAGNOSIS — R9721 Rising PSA following treatment for malignant neoplasm of prostate: Secondary | ICD-10-CM

## 2024-06-16 ENCOUNTER — Ambulatory Visit (HOSPITAL_COMMUNITY)
Admission: RE | Admit: 2024-06-16 | Discharge: 2024-06-16 | Disposition: A | Discharge status: Home / Self Care | Source: Ambulatory Visit | Attending: Urology | Admitting: Urology

## 2024-06-16 DIAGNOSIS — R9721 Rising PSA following treatment for malignant neoplasm of prostate: Secondary | ICD-10-CM | POA: Diagnosis not present

## 2024-06-16 DIAGNOSIS — K4090 Unilateral inguinal hernia, without obstruction or gangrene, not specified as recurrent: Secondary | ICD-10-CM | POA: Insufficient documentation

## 2024-06-16 MED ORDER — FLUDEOXYGLUCOSE F - 18 (FDG) INJECTION: 8.6600 | Freq: Once | INTRAVENOUS | Status: AC | PRN

## 2024-06-16 MED ORDER — FLOTUFOLASTAT F 18 GALLIUM 296-5846 MBQ/ML IV SOLN
8.6600 | Freq: Once | INTRAVENOUS | Status: AC
  Administered 2024-06-16: 8.66 via INTRAVENOUS

## 2024-06-21 ENCOUNTER — Ambulatory Visit: Payer: Self-pay

## 2024-06-21 NOTE — Telephone Encounter (Signed)
-----   Message from Garnette Shack, MD sent at 06/21/2024 12:04 PM EST ----- Please call patient.  Good news, the PET scan did not show any evidence of prostate cancer.  Continue the same appointment and June.

## 2024-06-21 NOTE — Telephone Encounter (Signed)
 LVM making pt aware of MD response.

## 2024-10-18 ENCOUNTER — Ambulatory Visit: Admitting: Urology
